# Patient Record
Sex: Female | Born: 1960 | Race: White | Hispanic: No | Marital: Married | Smoking: Current every day smoker
Health system: Southern US, Community
[De-identification: ages and names within clinical notes are randomized; demographics above are authoritative.]

## PROBLEM LIST (undated history)

## (undated) DIAGNOSIS — R030 Elevated blood-pressure reading, without diagnosis of hypertension: Secondary | ICD-10-CM

## (undated) DIAGNOSIS — Z87442 Personal history of urinary calculi: Secondary | ICD-10-CM

## (undated) DIAGNOSIS — I10 Essential (primary) hypertension: Secondary | ICD-10-CM

## (undated) DIAGNOSIS — N200 Calculus of kidney: Secondary | ICD-10-CM

## (undated) DIAGNOSIS — K5792 Diverticulitis of intestine, part unspecified, without perforation or abscess without bleeding: Secondary | ICD-10-CM

## (undated) DIAGNOSIS — K219 Gastro-esophageal reflux disease without esophagitis: Secondary | ICD-10-CM

## (undated) HISTORY — DX: Calculus of kidney: N20.0

## (undated) HISTORY — DX: Gastro-esophageal reflux disease without esophagitis: K21.9

---

## 2009-09-17 ENCOUNTER — Ambulatory Visit: Payer: Self-pay | Admitting: Family Medicine

## 2009-09-19 ENCOUNTER — Ambulatory Visit: Payer: Self-pay | Admitting: Internal Medicine

## 2016-01-28 DIAGNOSIS — I10 Essential (primary) hypertension: Secondary | ICD-10-CM | POA: Insufficient documentation

## 2016-01-28 DIAGNOSIS — Z0000 Encounter for general adult medical examination without abnormal findings: Secondary | ICD-10-CM | POA: Insufficient documentation

## 2016-01-28 DIAGNOSIS — Z720 Tobacco use: Secondary | ICD-10-CM | POA: Insufficient documentation

## 2016-01-28 LAB — HM PAP SMEAR

## 2017-07-15 ENCOUNTER — Ambulatory Visit
Admission: EM | Admit: 2017-07-15 | Discharge: 2017-07-15 | Disposition: A | Discharge status: Home / Self Care | Payer: Self-pay | Attending: Family Medicine | Admitting: Family Medicine

## 2017-07-15 ENCOUNTER — Other Ambulatory Visit: Payer: Self-pay

## 2017-07-15 ENCOUNTER — Ambulatory Visit (INDEPENDENT_AMBULATORY_CARE_PROVIDER_SITE_OTHER): Payer: Self-pay

## 2017-07-15 ENCOUNTER — Encounter: Payer: Self-pay | Admitting: Emergency Medicine

## 2017-07-15 DIAGNOSIS — M25531 Pain in right wrist: Secondary | ICD-10-CM

## 2017-07-15 DIAGNOSIS — W19XXXA Unspecified fall, initial encounter: Secondary | ICD-10-CM

## 2017-07-15 DIAGNOSIS — S52501A Unspecified fracture of the lower end of right radius, initial encounter for closed fracture: Secondary | ICD-10-CM

## 2017-07-15 MED ORDER — HYDROCODONE-ACETAMINOPHEN 5-325 MG PO TABS: ORAL_TABLET | ORAL | 0 refills | Status: DC

## 2017-07-15 MED ORDER — KETOROLAC TROMETHAMINE 60 MG/2ML IM SOLN
60.0000 mg | Freq: Once | INTRAMUSCULAR | Status: AC
  Administered 2017-07-15: 60 mg via INTRAMUSCULAR

## 2017-07-15 NOTE — ED Triage Notes (Signed)
Pt here today c/o right wrist pain. She fell in the yard last night and tried to catch herself with her wrist. It is now swollen, bruised and painful. She has decreased ROM.

## 2017-07-15 NOTE — Discharge Instructions (Signed)
Rest, elevation, over the counter ibuprofen (advil/motrin) Follow up with orthopedist this week

## 2017-07-15 NOTE — ED Provider Notes (Signed)
[facility]    CSN: [id number] Arrival date & time: 07/15/17  0802     History   Chief Complaint Chief Complaint  Patient presents with  . Wrist Pain    right    HPI ALICIANNA LITCHFORD is a 57 y.o. female.   57 yo female with a c/o right wrist pain since falling in her yard last night. States she tripped and landed on her outstretched hand with subsequent pain and swelling.   The history is provided by the patient.  Wrist Pain     History reviewed. No pertinent past medical history.  There are no active problems to display for this patient.   Past Surgical History:  Procedure Laterality Date  . CESAREAN SECTION  1985    OB History   None      Home Medications    Prior to Admission medications   Medication Sig Start Date End Date Taking? Authorizing Provider  HYDROcodone-acetaminophen (NORCO/VICODIN) 5-325 MG tablet 1-2 tabs po bid prn 07/15/17   Payton Mccallum, MD    Family History No family history on file.  Social History Social History   Tobacco Use  . Smoking status: Current Every Day Smoker    Packs/day: 0.50    Types: Cigarettes  . Smokeless tobacco: Never Used  Substance Use Topics  . Alcohol use: Never    Frequency: Never  . Drug use: Never     Allergies   Patient has no known allergies.   Review of Systems Review of Systems   Physical Exam Triage Vital Signs ED Triage Vitals  Enc Vitals Group     BP 07/15/17 0817 (!) 148/89     Pulse Rate 07/15/17 0817 76     Resp 07/15/17 0817 16     Temp 07/15/17 0817 98.9 F (37.2 C)     Temp Source 07/15/17 0817 Oral     SpO2 07/15/17 0817 100 %     Weight 07/15/17 0815 135 lb (61.2 kg)     Height 07/15/17 0815 5\' 1"  (1.549 m)     Head Circumference --      Peak Flow --      Pain Score 07/15/17 0814 3     Pain Loc --      Pain Edu? --      Excl. in GC? --    No data found.  Updated Vital Signs BP (!) 148/89 (BP Location: Left Arm)   Pulse 76   Temp 98.9 F  (37.2 C) (Oral)   Resp 16   Ht 5\' 1"  (1.549 m)   Wt 135 lb (61.2 kg)   SpO2 100%   BMI 25.51 kg/m   Visual Acuity Right Eye Distance:   Left Eye Distance:   Bilateral Distance:    Right Eye Near:   Left Eye Near:    Bilateral Near:     Physical Exam  Constitutional: She appears well-developed and well-nourished. No distress.  Musculoskeletal:       Right wrist: She exhibits decreased range of motion, tenderness, bony tenderness, swelling and deformity. She exhibits no effusion, no crepitus and no laceration.  Skin: She is not diaphoretic.  Nursing note and vitals reviewed.    UC Treatments / Results  Labs (all labs ordered are listed, but only abnormal results are displayed) Labs Reviewed - No data to display  EKG None  Radiology Dg Wrist Complete Right  Result Date: 07/15/2017 CLINICAL DATA:  Fall, landed on wrist, pain EXAM: RIGHT  WRIST - COMPLETE 3+ VIEW COMPARISON:  None. FINDINGS: Transverse distal radial fracture, without intra-articular extension. Mild soft tissue swelling. IMPRESSION: Transverse distal radial fracture, as above. Electronically Signed   By: Charline BillsSriyesh  Krishnan M.D.   On: 07/15/2017 09:00    Procedures Procedures (including critical care time)  Medications Ordered in UC Medications  ketorolac (TORADOL) injection 60 mg (60 mg Intramuscular Given 07/15/17 0836)    Initial Impression / Assessment and Plan / UC Course  I have reviewed the triage vital signs and the nursing notes.  Pertinent labs & imaging results that were available during my care of the patient were reviewed by me and considered in my medical decision making (see chart for details).     Final Clinical Impressions(s) / UC Diagnoses   Final diagnoses:  Closed fracture of distal end of right radius, unspecified fracture morphology, initial encounter     Discharge Instructions     Rest, elevation, over the counter ibuprofen (advil/motrin) Follow up with orthopedist this  week    ED Prescriptions    Medication Sig Dispense Auth. Provider   HYDROcodone-acetaminophen (NORCO/VICODIN) 5-325 MG tablet 1-2 tabs po bid prn 6 tablet Payton Mccallumonty, Taner Rzepka, MD     1. x-ray results and diagnosis reviewed with patient 2. Immobilized with sugar tong splint and sling 3. rx as per orders above; reviewed possible side effects, interactions, risks and benefits  4. Recommend supportive treatment as above   5. Follow-up with orthopedist this week  Controlled Substance Prescriptions [place] Controlled Substance Registry consulted? Not Applicable   Payton Mccallumonty, Leeta Grimme, MD 07/15/17 1143

## 2018-01-26 ENCOUNTER — Other Ambulatory Visit: Payer: Self-pay

## 2018-01-26 ENCOUNTER — Encounter: Payer: Self-pay | Admitting: Emergency Medicine

## 2018-01-26 ENCOUNTER — Ambulatory Visit
Admission: EM | Admit: 2018-01-26 | Discharge: 2018-01-26 | Disposition: A | Discharge status: Home / Self Care | Payer: Self-pay | Attending: Family Medicine | Admitting: Family Medicine

## 2018-01-26 DIAGNOSIS — R3129 Other microscopic hematuria: Secondary | ICD-10-CM | POA: Insufficient documentation

## 2018-01-26 DIAGNOSIS — M545 Low back pain, unspecified: Secondary | ICD-10-CM

## 2018-01-26 DIAGNOSIS — I10 Essential (primary) hypertension: Secondary | ICD-10-CM

## 2018-01-26 HISTORY — DX: Essential (primary) hypertension: I10

## 2018-01-26 LAB — URINALYSIS, COMPLETE (UACMP) WITH MICROSCOPIC
Bilirubin Urine: NEGATIVE
Glucose, UA: NEGATIVE mg/dL
Nitrite: NEGATIVE
Protein, ur: NEGATIVE mg/dL
Specific Gravity, Urine: 1.025 (ref 1.005–1.030)
pH: 5 (ref 5.0–8.0)

## 2018-01-26 MED ORDER — METAXALONE 800 MG PO TABS: 800.0000 mg | ORAL_TABLET | Freq: Three times a day (TID) | ORAL | 0 refills | Status: DC

## 2018-01-26 MED ORDER — TAMSULOSIN HCL 0.4 MG PO CAPS: 0.4000 mg | ORAL_CAPSULE | Freq: Every morning | ORAL | 0 refills | Status: DC

## 2018-01-26 MED ORDER — NAPROXEN 500 MG PO TABS: 500.0000 mg | ORAL_TABLET | Freq: Two times a day (BID) | ORAL | 0 refills | Status: DC

## 2018-01-26 NOTE — ED Triage Notes (Signed)
Patient c/o mid back pain that started on Sunday. She stated that she reached down in the car to grab a bag and she felt it pull when she reached down. Denies urinary symptoms.

## 2018-01-26 NOTE — Discharge Instructions (Addendum)
Plenty of fluids.  Strain all of your urine to capture stone if possible.Use  Caution while taking muscle relaxers.  Do not perform activities requiring concentration or judgment and do not drive.  If you worsen go to the emergency room.  Otherwise you need to find a primary care physician to follow and evaluate the blood in your urine

## 2018-01-26 NOTE — ED Provider Notes (Signed)
[facility]    CSN: [id number] Arrival date & time: 01/26/18  3500     History   Chief Complaint Chief Complaint  Patient presents with  . Back Pain    HPI Teresa Salazar is a 58 y.o. female.   HPI  59 year old female presents with bilateral for lumbar pain started on Sunday when she reached around her car to grab a bag of song books for church felt a pull.  Has had over time that it seem to worsen.  He said it is definitely worse at nighttime.  She is only been using ibuprofen and low dosage but does not find very much help.  Does not radiate.  No previous history of any stones.  She is had no urinary symptoms.  Knows the pain on both sides of the spine.  Denies any incontinence.  Has symptoms of a head cold.      Past Medical History:  Diagnosis Date  . Hypertension     There are no active problems to display for this patient.   Past Surgical History:  Procedure Laterality Date  . CESAREAN SECTION  1985    OB History   No obstetric history on file.      Home Medications    Prior to Admission medications   Medication Sig Start Date End Date Taking? Authorizing Provider  lisinopril (PRINIVIL,ZESTRIL) 10 MG tablet Take by mouth. 01/29/16  Yes [provider]  metaxalone (SKELAXIN) 800 MG tablet Take 1 tablet (800 mg total) by mouth 3 (three) times daily. 01/26/18   Lutricia Feil, PA-C  naproxen (NAPROSYN) 500 MG tablet Take 1 tablet (500 mg total) by mouth 2 (two) times daily with a meal. 01/26/18   Lutricia Feil, PA-C  tamsulosin (FLOMAX) 0.4 MG CAPS capsule Take 1 capsule (0.4 mg total) by mouth every morning. 01/26/18   Lutricia Feil, PA-C    Family History History reviewed. No pertinent family history.  Social History Social History   Tobacco Use  . Smoking status: Current Every Day Smoker    Packs/day: 0.50    Types: Cigarettes  . Smokeless tobacco: Never Used  Substance Use Topics  . Alcohol use: Never   Frequency: Never  . Drug use: Never     Allergies   Patient has no known allergies.   Review of Systems Review of Systems  Constitutional: Positive for activity change. Negative for appetite change, chills, fatigue and fever.  HENT: Positive for congestion, postnasal drip and rhinorrhea.   Musculoskeletal: Positive for back pain and myalgias.  All other systems reviewed and are negative.    Physical Exam Triage Vital Signs ED Triage Vitals  Enc Vitals Group     BP 01/26/18 0831 (!) 151/100     Pulse Rate 01/26/18 0831 98     Resp 01/26/18 0831 18     Temp 01/26/18 0831 98 F (36.7 C)     Temp Source 01/26/18 0831 Oral     SpO2 01/26/18 0831 98 %     Weight 01/26/18 0832 140 lb (63.5 kg)     Height 01/26/18 0832 5\' 2"  (1.575 m)     Head Circumference --      Peak Flow --      Pain Score 01/26/18 0831 7     Pain Loc --      Pain Edu? --      Excl. in GC? --    No data found.  Updated Vital Signs BP Marland Kitchen)  151/100 (BP Location: Left Arm)   Pulse 98   Temp 98 F (36.7 C) (Oral)   Resp 18   Ht 5\' 2"  (1.575 m)   Wt 140 lb (63.5 kg)   SpO2 98%   BMI 25.61 kg/m   Visual Acuity Right Eye Distance:   Left Eye Distance:   Bilateral Distance:    Right Eye Near:   Left Eye Near:    Bilateral Near:     Physical Exam Vitals signs and nursing note reviewed. Exam conducted with a chaperone present.  Constitutional:      General: She is not in acute distress.    Appearance: Normal appearance. She is normal weight. She is not ill-appearing, toxic-appearing or diaphoretic.  HENT:     Head: Normocephalic.     Nose: Rhinorrhea present. No congestion.     Mouth/Throat:     Mouth: Mucous membranes are moist.     Pharynx: No oropharyngeal exudate or posterior oropharyngeal erythema.  Eyes:     General:        Right eye: No discharge.        Left eye: No discharge.     Conjunctiva/sclera: Conjunctivae normal.  Pulmonary:     Effort: Pulmonary effort is normal.      Breath sounds: Normal breath sounds.  Musculoskeletal:        General: Tenderness present.     Comments: Has a slightly high pelvis on the left in stance.  Lection shows a rightward takeoff she is able to forward flex with her hands to the level of her mid thigh.  Turning to upright posture is difficult.  Bilateral flexion is also limited due to pain.  That is worse to the right than the left.  Thoracic rotation also causes her to have discomfort.  Maximum tenderness is in the paraspinous muscles at the L3-4 level.  Will toe heel and heel walk normally.  DTRs are 2+/4 bilaterally symmetrical.  Skin:    General: Skin is warm and dry.  Neurological:     General: No focal deficit present.     Mental Status: She is alert and oriented to person, place, and time.  Psychiatric:        Mood and Affect: Mood normal.        Behavior: Behavior normal.        Thought Content: Thought content normal.        Judgment: Judgment normal.      UC Treatments / Results  Labs (all labs ordered are listed, but only abnormal results are displayed) Labs Reviewed  URINALYSIS, COMPLETE (UACMP) WITH MICROSCOPIC - Abnormal; Notable for the following components:      Result Value   APPearance HAZY (*)    Hgb urine dipstick MODERATE (*)    Ketones, ur TRACE (*)    Leukocytes, UA TRACE (*)    Bacteria, UA RARE (*)    All other components within normal limits  URINE CULTURE    EKG None  Radiology No results found.  Procedures Procedures (including critical care time)  Medications Ordered in UC Medications - No data to display  Initial Impression / Assessment and Plan / UC Course  I have reviewed the triage vital signs and the nursing notes.  Pertinent labs & imaging results that were available during my care of the patient were reviewed by me and considered in my medical decision making (see chart for details).   I have a discussion with the patient regarding her  laboratory and our physical  findings as well as her presentation.  Has had kidney stones in the past and this may be another kidney stone due to the hematuria that she exhibits.  States that the kidney stones were years ago and she also has not had a period as well.  She denies any vaginal discharge.  With her previous history of kidney stones it is likely that her back pain in part may be from kidney stones but also is for the pain that she is having after lifting the bag which seem to precipitate her symptoms.  She has no insurance and would prefer not to undergo CAT scan today.  Is reasonable to have her screen all of her urine and to treat her for a low back strain.  She will go to the emergency room if it worsens.  Otherwise I have recommended highly that she have a primary care physician who will follow her and further evaluate her hematuria.   Final Clinical Impressions(s) / UC Diagnoses   Final diagnoses:  Acute bilateral low back pain without sciatica  Other microscopic hematuria     Discharge Instructions     Plenty of fluids.  Strain all of your urine to capture stone if possible.Use  Caution while taking muscle relaxers.  Do not perform activities requiring concentration or judgment and do not drive.  If you worsen go to the emergency room.  Otherwise you need to find a primary care physician to follow and evaluate the blood in your urine    ED Prescriptions    Medication Sig Dispense Auth. Provider   tamsulosin (FLOMAX) 0.4 MG CAPS capsule Take 1 capsule (0.4 mg total) by mouth every morning. 30 capsule Ovid Curd P, PA-C   naproxen (NAPROSYN) 500 MG tablet Take 1 tablet (500 mg total) by mouth 2 (two) times daily with a meal. 60 tablet Lutricia Feil, PA-C   metaxalone (SKELAXIN) 800 MG tablet Take 1 tablet (800 mg total) by mouth 3 (three) times daily. 21 tablet Lutricia Feil, PA-C     Controlled Substance Prescriptions [place] Controlled Substance Registry consulted? No   Lutricia Feil,  PA-C 01/26/18 1007

## 2018-01-27 LAB — URINE CULTURE: Culture: NO GROWTH

## 2020-01-05 DIAGNOSIS — N321 Vesicointestinal fistula: Secondary | ICD-10-CM

## 2020-01-05 HISTORY — DX: Vesicointestinal fistula: N32.1

## 2020-04-10 ENCOUNTER — Encounter: Payer: Self-pay | Admitting: Emergency Medicine

## 2020-04-10 ENCOUNTER — Other Ambulatory Visit: Payer: Self-pay

## 2020-04-10 ENCOUNTER — Ambulatory Visit
Admission: EM | Admit: 2020-04-10 | Discharge: 2020-04-10 | Disposition: A | Discharge status: Home / Self Care | Payer: Self-pay | Attending: Sports Medicine | Admitting: Sports Medicine

## 2020-04-10 DIAGNOSIS — K2100 Gastro-esophageal reflux disease with esophagitis, without bleeding: Secondary | ICD-10-CM

## 2020-04-10 DIAGNOSIS — R300 Dysuria: Secondary | ICD-10-CM | POA: Insufficient documentation

## 2020-04-10 DIAGNOSIS — R310 Gross hematuria: Secondary | ICD-10-CM

## 2020-04-10 LAB — URINALYSIS, COMPLETE (UACMP) WITH MICROSCOPIC
Bacteria, UA: NONE SEEN
Bilirubin Urine: NEGATIVE
Glucose, UA: NEGATIVE mg/dL
Ketones, ur: 15 mg/dL — AB
Leukocytes,Ua: NEGATIVE
Nitrite: NEGATIVE
Protein, ur: 100 mg/dL — AB
RBC / HPF: >50 RBC/hpf (ref 0–5)
Specific Gravity, Urine: >1.03 — ABNORMAL HIGH (ref 1.005–1.030)
WBC, UA: NONE SEEN WBC/hpf (ref 0–5)
pH: 5.5 (ref 5.0–8.0)

## 2020-04-10 MED ORDER — OMEPRAZOLE 20 MG PO CPDR: 20.0000 mg | DELAYED_RELEASE_CAPSULE | Freq: Every day | ORAL | 0 refills | Status: DC

## 2020-04-10 MED ORDER — FAMOTIDINE 20 MG PO TABS: 20.0000 mg | ORAL_TABLET | Freq: Two times a day (BID) | ORAL | 0 refills | Status: DC

## 2020-04-10 NOTE — ED Provider Notes (Signed)
[facility]    CSN: [id number] Arrival date & time: 04/10/20  1419      History   Chief Complaint Chief Complaint  Patient presents with  . Hematuria  . Dysuria       . Abdominal Pain  . Rash    HPI Teresa Salazar is a 60 y.o. female.   Patient is a pleasant 60 year old female who presents for evaluation of several issues.  The first is blood in her urine with dysuria.  Its been present now for about 2 months.  She is a smoker.  She reports no increased frequency or urgency.  She also reports no vaginal discharge or vaginal bleeding.  No vaginal pain either.  No flank pain.  She reports some mild suprapubic discomfort as well.  She has been trying to establish care with a PCP.  She reports that she has an appointment in [place] on April 17, 2020.  Her second issue is suprapubic abdominal pain.  She points to the epigastric area.  She says is worse if she drinks a lot of soda.  She did vomit today.  Is also worse if she eats spicy foods.  She denies any significant chest pain shortness of breath.  There is no jaw pain or diaphoresis.  No red flag signs or symptoms were elicited on history.     Past Medical History:  Diagnosis Date  . Hypertension     There are no problems to display for this patient.   Past Surgical History:  Procedure Laterality Date  . CESAREAN SECTION  1985    OB History   No obstetric history on file.      Home Medications    Prior to Admission medications   Medication Sig Start Date End Date Taking? Authorizing Provider  famotidine (PEPCID) 20 MG tablet Take 1 tablet (20 mg total) by mouth 2 (two) times daily. 04/10/20  Yes Delton See, MD  omeprazole (PRILOSEC) 20 MG capsule Take 1 capsule (20 mg total) by mouth daily. 04/10/20  Yes Delton See, MD  lisinopril (PRINIVIL,ZESTRIL) 10 MG tablet Take by mouth. 01/29/16   [provider]  metaxalone (SKELAXIN) 800 MG tablet Take 1 tablet (800 mg total) by  mouth 3 (three) times daily. 01/26/18   Lutricia Feil, PA-C  naproxen (NAPROSYN) 500 MG tablet Take 1 tablet (500 mg total) by mouth 2 (two) times daily with a meal. 01/26/18   Lutricia Feil, PA-C  tamsulosin (FLOMAX) 0.4 MG CAPS capsule Take 1 capsule (0.4 mg total) by mouth every morning. 01/26/18   Lutricia Feil, PA-C    Family History Family History  Problem Relation Age of Onset  . Diabetes Mother   . Diabetes Father     Social History Social History   Tobacco Use  . Smoking status: Current Every Day Smoker    Packs/day: 0.50    Types: Cigarettes  . Smokeless tobacco: Never Used  Vaping Use  . Vaping Use: Never used  Substance Use Topics  . Alcohol use: Never  . Drug use: Never     Allergies   Patient has no known allergies.   Review of Systems Review of Systems  Constitutional: Positive for appetite change. Negative for activity change, chills, diaphoresis, fatigue and fever.  HENT: Negative.  Negative for congestion, ear pain, postnasal drip, rhinorrhea, sinus pressure and sinus pain.   Eyes: Negative.  Negative for pain.  Respiratory: Negative for cough, chest tightness, shortness of breath, wheezing  and stridor.   Cardiovascular: Negative.  Negative for chest pain and palpitations.  Gastrointestinal: Positive for abdominal pain, nausea and vomiting. Negative for abdominal distention, blood in stool, constipation and diarrhea.  Genitourinary: Positive for dysuria and hematuria. Negative for flank pain, urgency, vaginal bleeding, vaginal discharge and vaginal pain.  Musculoskeletal: Negative.  Negative for arthralgias, back pain, myalgias and neck pain.  Skin: Positive for rash. Negative for color change, pallor and wound.  Neurological: Negative.  Negative for dizziness, syncope, light-headedness, numbness and headaches.  Hematological: Negative.  Negative for adenopathy. Does not bruise/bleed easily.  All other systems reviewed and are  negative.    Physical Exam Triage Vital Signs ED Triage Vitals  Enc Vitals Group     BP 04/10/20 1434 (!) 168/94     Pulse Rate 04/10/20 1434 78     Resp 04/10/20 1434 18     Temp 04/10/20 1434 98.6 F (37 C)     Temp Source 04/10/20 1434 Oral     SpO2 04/10/20 1434 99 %     Weight 04/10/20 1432 139 lb 15.9 oz (63.5 kg)     Height 04/10/20 1432 5\' 2"  (1.575 m)     Head Circumference --      Peak Flow --      Pain Score 04/10/20 1432 6     Pain Loc --      Pain Edu? --      Excl. in GC? --    No data found.  Updated Vital Signs BP (!) 168/94 (BP Location: Left Arm)   Pulse 78   Temp 98.6 F (37 C) (Oral)   Resp 18   Ht 5\' 2"  (1.575 m)   Wt 63.5 kg   SpO2 99%   BMI 25.60 kg/m   Visual Acuity Right Eye Distance:   Left Eye Distance:   Bilateral Distance:    Right Eye Near:   Left Eye Near:    Bilateral Near:     Physical Exam Vitals and nursing note reviewed.  Constitutional:      General: She is not in acute distress.    Appearance: She is well-developed. She is not ill-appearing, toxic-appearing or diaphoretic.  HENT:     Head: Normocephalic and atraumatic.     Mouth/Throat:     Mouth: Mucous membranes are moist.     Pharynx: Oropharynx is clear. No pharyngeal swelling or oropharyngeal exudate.  Eyes:     General: No scleral icterus.    Extraocular Movements: Extraocular movements intact.     Pupils: Pupils are equal, round, and reactive to light.  Cardiovascular:     Rate and Rhythm: Normal rate and regular rhythm.     Heart sounds: Normal heart sounds. No murmur heard. No friction rub. No gallop.   Pulmonary:     Effort: Pulmonary effort is normal. No respiratory distress.     Breath sounds: Normal breath sounds. No stridor. No wheezing, rhonchi or rales.  Abdominal:     General: Abdomen is flat. Bowel sounds are normal. There is no distension. There are no signs of injury.     Palpations: Abdomen is soft. There is no shifting dullness, fluid  wave, hepatomegaly or splenomegaly.     Tenderness: There is abdominal tenderness in the suprapubic area. There is no right CVA tenderness, left CVA tenderness, guarding or rebound.  Skin:    General: Skin is warm.     Capillary Refill: Capillary refill takes less than 2 seconds.  Coloration: Skin is not cyanotic or jaundiced.     Findings: No erythema or rash.     Comments: No rash appreciated  Neurological:     General: No focal deficit present.     Mental Status: She is alert and oriented to person, place, and time.  Psychiatric:        Mood and Affect: Mood normal.        Behavior: Behavior normal.      UC Treatments / Results  Labs (all labs ordered are listed, but only abnormal results are displayed) Labs Reviewed  URINALYSIS, COMPLETE (UACMP) WITH MICROSCOPIC - Abnormal; Notable for the following components:      Result Value   Color, Urine BROWN (*)    Specific Gravity, Urine >1.030 (*)    Hgb urine dipstick LARGE (*)    Ketones, ur 15 (*)    Protein, ur 100 (*)    All other components within normal limits    EKG   Radiology No results found.  Procedures Procedures (including critical care time)  Medications Ordered in UC Medications - No data to display  Initial Impression / Assessment and Plan / UC Course  I have reviewed the triage vital signs and the nursing notes.  Pertinent labs & imaging results that were available during my care of the patient were reviewed by me and considered in my medical decision making (see chart for details).  Clinical impression: 1.  Hematuria with dysuria and suprapubic pain for 2 months. 2.  Epigastric discomfort worse if drinks soda or eats spicy foods.  Treatment plan: 1.  The findings and treatment plan were discussed in detail with the patient.  Patient was in agreement. 2.  Obtain a UA the results are above.  Urine was brown in color with elevated specific gravity of greater than 1.030.  There was a large amount  of hemoglobin small amount of ketones and 100 protein.  Greater than 50 RBCs noted.  No evidence of a urinary tract infection. 3.  Going to refer to urology for gross hematuria.  She is not giving me any indication she has kidney stones. 4.  Follow-up with her primary care physician next week. 5.  I prescribed Pepcid and Prilosec for her acid reflux symptoms.  6 educational handouts provided. 7.  Follow-up here as needed.    Final Clinical Impressions(s) / UC Diagnoses   Final diagnoses:  Gross hematuria  Gastroesophageal reflux disease with esophagitis without hemorrhage     Discharge Instructions     Your urine shows a large amount of blood.  I have made a referral to urology.  I have also given you the name and number of the urology clinic that you should call and say that you are seen in urgent care and they recommended you be seen as soon as possible. I did give you some educational handouts. Please keep your appointment with your primary care in [place] next week to establish care.  Regarding her epigastric pain I have prescribed a medication to assist with that. If you develop worsening bleeding, or you develop any significant chest pain or shortness of breath please call 911 and go to the ER.    ED Prescriptions    Medication Sig Dispense Auth. Provider   omeprazole (PRILOSEC) 20 MG capsule Take 1 capsule (20 mg total) by mouth daily. 30 capsule Delton See, MD   famotidine (PEPCID) 20 MG tablet Take 1 tablet (20 mg total) by mouth 2 (two) times  daily. 30 tablet Delton See, MD     PDMP not reviewed this encounter.   Delton See, MD 04/13/20 [phone]

## 2020-04-10 NOTE — ED Triage Notes (Signed)
Patient c/o hematuria, dysuria and abdominal pain that started 2 months ago. She also reports a rash on her back that also started 2 months ago.

## 2020-04-10 NOTE — Discharge Instructions (Addendum)
Your urine shows a large amount of blood.  I have made a referral to urology.  I have also given you the name and number of the urology clinic that you should call and say that you are seen in urgent care and they recommended you be seen as soon as possible. I did give you some educational handouts. Please keep your appointment with your primary care in [place] next week to establish care.  Regarding her epigastric pain I have prescribed a medication to assist with that. If you develop worsening bleeding, or you develop any significant chest pain or shortness of breath please call 911 and go to the ER.

## 2020-04-14 ENCOUNTER — Other Ambulatory Visit: Payer: Self-pay

## 2020-04-14 DIAGNOSIS — R310 Gross hematuria: Secondary | ICD-10-CM

## 2020-04-15 ENCOUNTER — Ambulatory Visit: Payer: Self-pay | Admitting: Urology

## 2020-04-15 ENCOUNTER — Other Ambulatory Visit: Payer: Self-pay

## 2020-04-15 ENCOUNTER — Ambulatory Visit
Admission: RE | Admit: 2020-04-15 | Discharge: 2020-04-15 | Disposition: A | Discharge status: Home / Self Care | Payer: Self-pay | Source: Ambulatory Visit | Attending: Urology | Admitting: Urology

## 2020-04-15 ENCOUNTER — Ambulatory Visit (INDEPENDENT_AMBULATORY_CARE_PROVIDER_SITE_OTHER): Payer: Self-pay | Admitting: Urology

## 2020-04-15 ENCOUNTER — Other Ambulatory Visit
Admission: RE | Admit: 2020-04-15 | Discharge: 2020-04-15 | Disposition: A | Discharge status: Home / Self Care | Payer: Self-pay | Attending: Urology | Admitting: Urology

## 2020-04-15 ENCOUNTER — Encounter: Payer: Self-pay | Admitting: Urology

## 2020-04-15 VITALS — BP 155/88 | HR 80 | Ht 62.0 in | Wt 115.0 lb

## 2020-04-15 DIAGNOSIS — R310 Gross hematuria: Secondary | ICD-10-CM | POA: Insufficient documentation

## 2020-04-15 LAB — COMPREHENSIVE METABOLIC PANEL
ALT: 14 U/L (ref 0–44)
AST: 17 U/L (ref 15–41)
Albumin: 3.6 g/dL (ref 3.5–5.0)
Alkaline Phosphatase: 81 U/L (ref 38–126)
Anion gap: 10 (ref 5–15)
BUN: 22 mg/dL — ABNORMAL HIGH (ref 6–20)
CO2: 23 mmol/L (ref 22–32)
Calcium: 9.1 mg/dL (ref 8.9–10.3)
Chloride: 106 mmol/L (ref 98–111)
Creatinine, Ser: 0.74 mg/dL (ref 0.44–1.00)
GFR, Estimated: >60 mL/min (ref >60)
Glucose, Bld: 96 mg/dL (ref 70–99)
Potassium: 3.7 mmol/L (ref 3.5–5.1)
Sodium: 139 mmol/L (ref 135–145)
Total Bilirubin: 0.3 mg/dL (ref 0.3–1.2)
Total Protein: 7.9 g/dL (ref 6.5–8.1)

## 2020-04-15 LAB — CBC WITH DIFFERENTIAL/PLATELET
Abs Immature Granulocytes: 0.05 10*3/uL (ref 0.00–0.07)
Basophils Absolute: 0.1 10*3/uL (ref 0.0–0.1)
Basophils Relative: 1 %
Eosinophils Absolute: 0.1 10*3/uL (ref 0.0–0.5)
Eosinophils Relative: 1 %
HCT: 36.2 % (ref 36.0–46.0)
Hemoglobin: 11.6 g/dL — ABNORMAL LOW (ref 12.0–15.0)
Immature Granulocytes: 1 %
Lymphocytes Relative: 20 %
Lymphs Abs: 1.9 10*3/uL (ref 0.7–4.0)
MCH: 27.6 pg (ref 26.0–34.0)
MCHC: 32 g/dL (ref 30.0–36.0)
MCV: 86 fL (ref 80.0–100.0)
Monocytes Absolute: 0.4 10*3/uL (ref 0.1–1.0)
Monocytes Relative: 4 %
Neutro Abs: 6.7 10*3/uL (ref 1.7–7.7)
Neutrophils Relative %: 73 %
Platelets: 408 10*3/uL — ABNORMAL HIGH (ref 150–400)
RBC: 4.21 MIL/uL (ref 3.87–5.11)
RDW: 15.9 % — ABNORMAL HIGH (ref 11.5–15.5)
WBC: 9.1 10*3/uL (ref 4.0–10.5)
nRBC: 0 % (ref 0.0–0.2)

## 2020-04-15 LAB — URINALYSIS, COMPLETE (UACMP) WITH MICROSCOPIC
Bacteria, UA: NONE SEEN
Bilirubin Urine: NEGATIVE
Glucose, UA: NEGATIVE mg/dL
Ketones, ur: 15 mg/dL — AB
Nitrite: NEGATIVE
Protein, ur: NEGATIVE mg/dL
RBC / HPF: >50 RBC/hpf (ref 0–5)
Specific Gravity, Urine: 1.025 (ref 1.005–1.030)
pH: 5.5 (ref 5.0–8.0)

## 2020-04-15 MED ORDER — IOHEXOL 300 MG/ML  SOLN
150.0000 mL | Freq: Once | INTRAMUSCULAR | Status: AC | PRN
  Administered 2020-04-15: 125 mL via INTRAVENOUS

## 2020-04-15 NOTE — H&P (View-Only) (Signed)
   04/15/20 2:28 PM    Sink 04-09-1960 [id number]  CC: Gross hematuria, weight loss  HPI: I saw Teresa Salazar today in clinic for evaluation of gross hematuria.  She is a 60 year old female with extensive smoking history(75 pack years) who reports 2 to 3 months of gross hematuria.  She also reports approximately 30 pounds of weight loss during that time.  She does not regularly get medical care.  She is also having some lower abdominal pain that is nonspecific.  She denies any renal colic or flank pain.  Urinalysis at urgent care notable for greater than 50 RBCs, but otherwise benign.  No imaging has been performed.   PMH: Past Medical History:  Diagnosis Date  . Hypertension     Surgical History: Past Surgical History:  Procedure Laterality Date  . CESAREAN SECTION  1985     Family History: Family History  Problem Relation Age of Onset  . Diabetes Mother   . Diabetes Father     Social History:  reports that she has been smoking cigarettes. She has been smoking about 0.50 packs per day. She has never used smokeless tobacco. She reports that she does not drink alcohol and does not use drugs.  Physical Exam: BP (!) 155/88   Pulse 80   Ht 5\' 2"  (1.575 m)   Wt 115 lb (52.2 kg)   BMI 21.03 kg/m    Constitutional:  Alert and oriented, No acute distress. Cardiovascular: No clubbing, cyanosis, or edema. Respiratory: Normal respiratory effort, no increased work of breathing. GI: Abdomen is soft, mildly tender in left lower quadrant, no peritonitis, nondistended  Laboratory Data: Reviewed, see HPI  Pertinent Imaging: None to review  Assessment & Plan:   60 year old female with 2 to 3 months of gross hematuria as well as 30 pounds of weight loss and vague lower abdominal pain of unclear etiology.  She has extensive smoking history concerning for malignancy. We discussed common possible etiologies of hematuria including BPH, malignancy, urolithiasis, medical  renal disease, and idiopathic. Standard workup recommended by the [facility] includes imaging with CT urogram to assess the upper tracts, and cystoscopy. Cytology is performed on patient's with gross hematuria to look for malignant cells in the urine.  CMP and CBC today Stat CT urogram, will call with results Schedule cystoscopy to complete hematuria work-up   46, MD 04/15/2020  [facility] [address], [address] [place], [place] [phone]

## 2020-04-15 NOTE — Progress Notes (Signed)
   04/15/20 2:28 PM   Teresa Salazar 04/21/1960 [id number]  CC: Gross hematuria, weight loss  HPI: I saw Teresa Salazar today in clinic for evaluation of gross hematuria.  She is a 59-year-old female with extensive smoking history(75 pack years) who reports 2 to 3 months of gross hematuria.  She also reports approximately 30 pounds of weight loss during that time.  She does not regularly get medical care.  She is also having some lower abdominal pain that is nonspecific.  She denies any renal colic or flank pain.  Urinalysis at urgent care notable for greater than 50 RBCs, but otherwise benign.  No imaging has been performed.   PMH: Past Medical History:  Diagnosis Date  . Hypertension     Surgical History: Past Surgical History:  Procedure Laterality Date  . CESAREAN SECTION  1985     Family History: Family History  Problem Relation Age of Onset  . Diabetes Mother   . Diabetes Father     Social History:  reports that she has been smoking cigarettes. She has been smoking about 0.50 packs per day. She has never used smokeless tobacco. She reports that she does not drink alcohol and does not use drugs.  Physical Exam: BP (!) 155/88   Pulse 80   Ht 5' 2" (1.575 m)   Wt 115 lb (52.2 kg)   BMI 21.03 kg/m    Constitutional:  Alert and oriented, No acute distress. Cardiovascular: No clubbing, cyanosis, or edema. Respiratory: Normal respiratory effort, no increased work of breathing. GI: Abdomen is soft, mildly tender in left lower quadrant, no peritonitis, nondistended  Laboratory Data: Reviewed, see HPI  Pertinent Imaging: None to review  Assessment & Plan:   59-year-old female with 2 to 3 months of gross hematuria as well as 30 pounds of weight loss and vague lower abdominal pain of unclear etiology.  She has extensive smoking history concerning for malignancy. We discussed common possible etiologies of hematuria including BPH, malignancy, urolithiasis, medical  renal disease, and idiopathic. Standard workup recommended by the [facility]UA includes imaging with CT urogram to assess the upper tracts, and cystoscopy. Cytology is performed on patient's with gross hematuria to look for malignant cells in the urine.  CMP and CBC today Stat CT urogram, will call with results Schedule cystoscopy to complete hematuria work-up   Laycie Schriner, MD 04/15/2020  [facility] [address], [address] [place], [place] [address] [phone]   

## 2020-04-16 ENCOUNTER — Other Ambulatory Visit: Payer: Self-pay

## 2020-04-16 ENCOUNTER — Encounter: Payer: Self-pay | Admitting: Surgery

## 2020-04-16 ENCOUNTER — Ambulatory Visit: Payer: Self-pay | Admitting: Surgery

## 2020-04-16 ENCOUNTER — Telehealth: Payer: Self-pay

## 2020-04-16 VITALS — BP 156/88 | HR 87 | Temp 98.6°F | Ht 61.0 in | Wt 115.4 lb

## 2020-04-16 DIAGNOSIS — Z1239 Encounter for other screening for malignant neoplasm of breast: Secondary | ICD-10-CM

## 2020-04-16 DIAGNOSIS — L988 Other specified disorders of the skin and subcutaneous tissue: Secondary | ICD-10-CM

## 2020-04-16 DIAGNOSIS — J438 Other emphysema: Secondary | ICD-10-CM

## 2020-04-16 LAB — URINE CULTURE: Culture: NO GROWTH

## 2020-04-16 MED ORDER — METRONIDAZOLE 500 MG PO TABS: 500.0000 mg | ORAL_TABLET | Freq: Three times a day (TID) | ORAL | 0 refills | Status: AC

## 2020-04-16 MED ORDER — CIPROFLOXACIN HCL 500 MG PO TABS: 500.0000 mg | ORAL_TABLET | Freq: Two times a day (BID) | ORAL | 0 refills | Status: AC

## 2020-04-16 NOTE — Patient Instructions (Addendum)
Please pick up prescriptions from your pharmacy. Your CT is scheduled for 05/01/20 @ 1 pm (arrive 15 minutes early) at [facility]. Nothing to eat or drink 4 hours prior to scan. Please pick up contrast at any [facility] Department between now and the day before CT. If you have any concerns or questions, please feel free to call our office.

## 2020-04-16 NOTE — Telephone Encounter (Signed)
Patient added to Dr.Pabon's schedule today-left message for patient to call office.

## 2020-04-16 NOTE — Progress Notes (Signed)
Patient ID: Teresa Salazar, female   DOB: 01-08-1960, 60 y.o.   MRN: [id number]  HPI Teresa Salazar is a 60 y.o. female seen in consultation at the request of Dr. Richardo Hanks.  She recently saw Dr. Renaldo Harrison due to lower abdominal pain and recurrent urinary tract infections.  She reports that she has been having the lower abdominal pain for quite a while and is close to 4 to 8 weeks.  Pain is intermittent dull and moderate in nature.  No specific alleviating or aggravating factors.  She does report burning when passing urine and also pneumaturia.  Also reports about 20 to 30 pound weight loss over the last 3 months. She is saw urology yesterday and a CT scan was ordered.  I have personally reviewed the CT scan showing evidence of diverticulitis with colovesicle fistula and a small abscess within the mesentery of the sigmoid colon.  The small collection unlikely to be amenable for percutaneous drainage due to multiple loops of bowel being in between the abscess and the abdominal wall.  He denies any fevers any chills.  She is has significant debility and fatigue  HPI  Past Medical History:  Diagnosis Date  . Hypertension     Past Surgical History:  Procedure Laterality Date  . CESAREAN SECTION  1985    Family History  Problem Relation Age of Onset  . Diabetes Mother   . Diabetes Father     Social History Social History   Tobacco Use  . Smoking status: Current Every Day Smoker    Packs/day: 0.50    Types: Cigarettes  . Smokeless tobacco: Never Used  Vaping Use  . Vaping Use: Never used  Substance Use Topics  . Alcohol use: Never  . Drug use: Never    No Known Allergies  Current Outpatient Medications  Medication Sig Dispense Refill  . ciprofloxacin (CIPRO) 500 MG tablet Take 1 tablet (500 mg total) by mouth 2 (two) times daily for 14 days. 28 tablet 0  . famotidine (PEPCID) 20 MG tablet Take 1 tablet (20 mg total) by mouth 2 (two) times daily. 30 tablet 0  . metroNIDAZOLE  (FLAGYL) 500 MG tablet Take 1 tablet (500 mg total) by mouth 3 (three) times daily for 14 days. 42 tablet 0  . lisinopril (PRINIVIL,ZESTRIL) 10 MG tablet Take by mouth. (Patient not taking: Reported on 04/16/2020)    . metaxalone (SKELAXIN) 800 MG tablet Take 1 tablet (800 mg total) by mouth 3 (three) times daily. (Patient not taking: Reported on 04/16/2020) 21 tablet 0  . naproxen (NAPROSYN) 500 MG tablet Take 1 tablet (500 mg total) by mouth 2 (two) times daily with a meal. (Patient not taking: Reported on 04/16/2020) 60 tablet 0  . omeprazole (PRILOSEC) 20 MG capsule Take 1 capsule (20 mg total) by mouth daily. (Patient not taking: Reported on 04/16/2020) 30 capsule 0  . tamsulosin (FLOMAX) 0.4 MG CAPS capsule Take 1 capsule (0.4 mg total) by mouth every morning. (Patient not taking: Reported on 04/16/2020) 30 capsule 0   No current facility-administered medications for this visit.     Review of Systems Full ROS  was asked and was negative except for the information on the HPI  Physical Exam Blood pressure (!) 156/88, pulse 87, temperature 98.6 F (37 C), temperature source Oral, height 5\' 1"  (1.549 m), weight 115 lb 6.4 oz (52.3 kg), SpO2 97 %. CONSTITUTIONAL: debilitated and malnourished  EYES: Pupils are equal, round, , Sclera are non-icteric. EARS, NOSE, MOUTH  AND THROAT: T she is wearing a mask , hearing is intact to voice. LYMPH NODES:  Lymph nodes in the neck are normal. RESPIRATORY:  Lungs are clear. There is normal respiratory effort, with equal breath sounds bilaterally, and without pathologic use of accessory muscles. CARDIOVASCULAR: Heart is regular without murmurs, gallops, or rubs. GI: The abdomen is  soft, mild tenderness palpation left lower quadrant without peritonitis and nondistended. There are no palpable masses. There is no hepatosplenomegaly. There are normal bowel sounds GU: Rectal deferred.   MUSCULOSKELETAL: Normal muscle strength and tone. No cyanosis or edema.    SKIN: Turgor is good and there are no pathologic skin lesions or ulcers. NEUROLOGIC: Motor and sensation is grossly normal. Cranial nerves are grossly intact. PSYCH:  Oriented to person, place and time. Affect is normal.  Data Reviewed  I have personally reviewed the patient's imaging, laboratory findings and medical records.    Assessment/Plan 60 year old female with diverticulitis and a small abscess as well as a colovesical fistula.  We will start her on broad-spectrum antibiotics ciprofloxacin and Flagyl for at least 2 weeks.  She is not toxic at does not require admission or surgical intervention.  She does have the abscess but is deep within the mesentery and it is small and should respond to antibiotic therapy.  I will have her come back in 2 to 3 weeks with a repeat CT to assess for active diverticulitis and abscess.  Discussed with the patient detail about the further need for sigmoidoscopy at some point in time and continue urological work-up.  Given that she has complicated diverticulitis and ongoing fistula she will require sigmoid colectomy at some point in time.  I had an extensive discussion with the patient regarding this.  She does not have any medical care and given all the risk factors I do think is prudent to go ahead and obtain a chest x-ray, mammography .  We will see her back in 2 to 3 weeks.  A copy of this report was sent to the referring provider   Time spent with the patient was 65 minutes, with more than 50% of the time spent in face-to-face education, counseling and care coordination.     Sterling Big, MD FACS General Surgeon 04/16/2020, 3:12 PM

## 2020-04-17 ENCOUNTER — Ambulatory Visit: Payer: Self-pay | Admitting: Family Medicine

## 2020-04-17 ENCOUNTER — Other Ambulatory Visit: Payer: Self-pay

## 2020-04-17 ENCOUNTER — Encounter: Payer: Self-pay | Admitting: Family Medicine

## 2020-04-17 VITALS — BP 132/68 | HR 77 | Temp 97.7°F | Ht 61.0 in | Wt 115.2 lb

## 2020-04-17 DIAGNOSIS — N200 Calculus of kidney: Secondary | ICD-10-CM

## 2020-04-17 DIAGNOSIS — R310 Gross hematuria: Secondary | ICD-10-CM

## 2020-04-17 DIAGNOSIS — N321 Vesicointestinal fistula: Secondary | ICD-10-CM

## 2020-04-17 DIAGNOSIS — K5792 Diverticulitis of intestine, part unspecified, without perforation or abscess without bleeding: Secondary | ICD-10-CM

## 2020-04-17 DIAGNOSIS — K219 Gastro-esophageal reflux disease without esophagitis: Secondary | ICD-10-CM

## 2020-04-17 NOTE — Progress Notes (Signed)
Subjective:    Patient ID: Teresa Salazar, female    DOB: October 17, 1960, 60 y.o.   MRN: [id number]  Teresa Salazar is a 60 y.o. female presenting on 04/17/2020 for Establish Care and Hematuria  Previous PCP in [facility].  HPI   Gross Hematuria / History of Kidney Stones Dysuria Urgent Care on 04/10/20 - [place] 2-3 months of hematuria reported. She is a smoker. She had UA done at Urgent Care, they referred her to [facility]. She had CT imaging done on 4/12  Showed nephrolithiasis, and kidney cyst also rectosigmoid fistula. Upcoming cystoscopy on 4/21 with Urology. Future repeat CT imaging.  Colo-vesicular Fistula Diverticulitis Followed by Dr Everlene Farrier Recently seen yesterday. Had CT imaging reviewed. Identified diverticulitis flare and small abscess with fistula. Not amenable to drainage. They started her on Cipro + Flagyl for 2 weeks. Repeat CT in 2-3 weeks for abscess follow-up. Cipro/Flagyl for 14 days  Abdominal Pain GERD Worse with trigger foods. On Famotidine 20mg  BID and Omeprazole 20mg  daily, with improvement. Has 1 month fully. Admits nausea with x 1 vomiting but that has improved. Denies any dark stool or blood  Health Maintenance: Mammogram was ordered recently by Gen Surgery. Defer other screening until acute problems resolved and will have follow-up physical  Depression screen [facility] 2/9 04/17/2020  Decreased Interest 1  Down, Depressed, Hopeless 0  PHQ - 2 Score 1    Past Medical History:  Diagnosis Date  . GERD (gastroesophageal reflux disease)   . Kidney stones    Past Surgical History:  Procedure Laterality Date  . CESAREAN SECTION  1985   Social History   Socioeconomic History  . Marital status: Married    Spouse name: Not on file  . Number of children: Not on file  . Years of education: Not on file  . Highest education level: Not on file  Occupational History  . Not on file  Tobacco Use  . Smoking status: Current Every Day Smoker    Packs/day:  0.50    Types: Cigarettes  . Smokeless tobacco: Never Used  Vaping Use  . Vaping Use: Never used  Substance and Sexual Activity  . Alcohol use: Never  . Drug use: Never  . Sexual activity: Not on file  Other Topics Concern  . Not on file  Social History Narrative  . Not on file   Social Determinants of Health   Financial Resource Strain: Not on file  Food Insecurity: Not on file  Transportation Needs: Not on file  Physical Activity: Not on file  Stress: Not on file  Social Connections: Not on file  Intimate Partner Violence: Not on file   Family History  Problem Relation Age of Onset  . Diabetes Mother   . Diabetes Father    Current Outpatient Medications on File Prior to Visit  Medication Sig  . ciprofloxacin (CIPRO) 500 MG tablet Take 1 tablet (500 mg total) by mouth 2 (two) times daily for 14 days.  . famotidine (PEPCID) 20 MG tablet Take 1 tablet (20 mg total) by mouth 2 (two) times daily.  . metroNIDAZOLE (FLAGYL) 500 MG tablet Take 1 tablet (500 mg total) by mouth 3 (three) times daily for 14 days.  4/9 omeprazole (PRILOSEC) 20 MG capsule Take 1 capsule (20 mg total) by mouth daily.   No current facility-administered medications on file prior to visit.    Review of Systems Per HPI unless specifically indicated above      Objective:  BP 132/68 (BP Location: Left Arm, Patient Position: Sitting, Cuff Size: Normal)   Pulse 77   Temp 97.7 F (36.5 C) (Temporal)   Ht 5\' 1"  (1.549 m)   Wt 115 lb 3.2 oz (52.3 kg)   SpO2 99%   BMI 21.77 kg/m   Wt Readings from Last 3 Encounters:  04/17/20 115 lb 3.2 oz (52.3 kg)  04/16/20 115 lb 6.4 oz (52.3 kg)  04/15/20 115 lb (52.2 kg)    Physical Exam Vitals and nursing note reviewed.  Constitutional:      General: She is not in acute distress.    Appearance: She is well-developed. She is not diaphoretic.     Comments: Well-appearing, comfortable, cooperative  HENT:     Head: Normocephalic and atraumatic.  Eyes:      General:        Right eye: No discharge.        Left eye: No discharge.     Conjunctiva/sclera: Conjunctivae normal.  Cardiovascular:     Rate and Rhythm: Normal rate.  Pulmonary:     Effort: Pulmonary effort is normal.  Skin:    General: Skin is warm and dry.     Findings: No erythema or rash.  Neurological:     Mental Status: She is alert and oriented to person, place, and time.  Psychiatric:        Behavior: Behavior normal.     Comments: Well groomed, good eye contact, normal speech and thoughts       I have personally reviewed the radiology report from 04/15/20 CT.  CT HEMATURIA WORKUP [[id number]][[id number]] Resulted: 04/15/20 1222  Order Status: Completed Updated: 04/15/20 1224  Narrative:   CLINICAL DATA: Gross hematuria, weight loss   EXAM:  CT ABDOMEN AND PELVIS WITHOUT AND WITH CONTRAST   TECHNIQUE:  Multidetector CT imaging of the abdomen and pelvis was performed  following the standard protocol before and following the bolus  administration of intravenous contrast.   CONTRAST: 125mL OMNIPAQUE IOHEXOL 300 MG/ML SOLN   COMPARISON: None.   FINDINGS:  Lower chest: Lung bases are clear.   Hepatobiliary: Liver is within normal limits.   Gallbladder is unremarkable. No intrahepatic or extrahepatic ductal  dilatation.   Pancreas: Within normal limits.   Spleen: Within normal limits.   Adrenals/Urinary Tract: Low-density lesions in the bilateral renal  glands, measuring up to 19 mm on the left (series 4/image 15),  reflecting benign adrenal adenomas.   Numerous bilateral renal cysts, measuring up to 16 mm in the  anterior right upper kidney (series 13/image 25), benign (Bosniak  I). Additional scattered hyperdense lesions, including a 12 mm  hemorrhagic cyst in the lateral interpolar right kidney (series  2/image 35), benign (Bosniak II). No enhancing renal lesions.   Four nonobstructing right renal calculi measuring up to 3 mm.  Additional nonobstructing  left lower pole renal calculi and a  dominant 5 x 10 mm calculus in the left renal collecting system  (series 2/image 29). No ureteral or bladder calculi. No  hydronephrosis.   On delayed imaging, there are no filling defects in the bilateral  opacified proximal collecting systems, ureters, or bladder.   Bladder is within normal limits.   Stomach/Bowel: Stomach is within normal limits.   No evidence of bowel obstruction.   Appendix is not discretely visualized.   Wall thickening involving the rectosigmoid colon with 4.2 x 2.1 x  2.4 cm rim enhancing irregular fluid collection in the rectosigmoid  mesocolon (series 4/image 51)  and additional fistulous communication  in the left lower pelvis (series 4/image 56), reflecting a  colorectal fistula with abscess.   Vascular/Lymphatic: No evidence of abdominal aortic aneurysm.   Atherosclerotic calcifications of the abdominal aorta and branch  vessels.   No suspicious abdominopelvic lymphadenopathy.   Reproductive: Status post hysterectomy.   No adnexal masses.   Other: No abdominopelvic ascites.   Musculoskeletal: Visualized osseous structures are within normal  limits.   IMPRESSION:  10 x 5 mm calculus in the left renal collecting system. Additional  nonobstructing bilateral calculi. No hydronephrosis.   Numerous bilateral renal cysts, some of which are hemorrhagic,  benign (Bosniak I-II). No enhancing renal lesions.   Rectosigmoid wall thickening with associated colorectal fistula and  4.2 cm fluid collection/abscess along the rectosigmoid mesocolon, as  above.   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    Electronically Signed  By: Charline Bills M.D.  On: 04/15/2020 12:22      Results for orders placed or performed during the hospital encounter of 04/15/20  Urine Culture   Specimen: Urine, Random  Result Value Ref Range   Specimen Description      URINE, RANDOM Performed  at [facility], [address], [place], [place] [address]    Special Requests      NONE Performed at [facility], [address], [place], [place] [address]    Culture      NO GROWTH Performed at [facility], [address], [place], [place] [address]    Report Status 04/16/2020 FINAL   Urinalysis, Complete w Microscopic  Result Value Ref Range   Color, Urine YELLOW YELLOW   APPearance HAZY (A) CLEAR   Specific Gravity, Urine 1.025 1.005 - 1.030   pH 5.5 5.0 - 8.0   Glucose, UA NEGATIVE NEGATIVE mg/dL   Hgb urine dipstick MODERATE (A) NEGATIVE   Bilirubin Urine NEGATIVE NEGATIVE   Ketones, ur 15 (A) NEGATIVE mg/dL   Protein, ur NEGATIVE NEGATIVE mg/dL   Nitrite NEGATIVE NEGATIVE   Leukocytes,Ua TRACE (A) NEGATIVE   Squamous Epithelial / LPF 0-5 0 - 5   WBC, UA 0-5 0 - 5 WBC/hpf   RBC / HPF >50 0 - 5 RBC/hpf   Bacteria, UA NONE SEEN NONE SEEN   Budding Yeast PRESENT    Ca Oxalate Crys, UA PRESENT   CBC with Differential/Platelet  Result Value Ref Range   WBC 9.1 4.0 - 10.5 K/uL   RBC 4.21 3.87 - 5.11 MIL/uL   Hemoglobin 11.6 (L) 12.0 - 15.0 g/dL   HCT 86.7 67.2 - 09.4 %   MCV 86.0 80.0 - 100.0 fL   MCH 27.6 26.0 - 34.0 pg   MCHC 32.0 30.0 - 36.0 g/dL   RDW 70.9 (H) 62.8 - 36.6 %   Platelets 408 (H) 150 - 400 K/uL   nRBC 0.0 0.0 - 0.2 %   Neutrophils Relative % 73 %   Neutro Abs 6.7 1.7 - 7.7 K/uL   Lymphocytes Relative 20 %   Lymphs Abs 1.9 0.7 - 4.0 K/uL   Monocytes Relative 4 %   Monocytes Absolute 0.4 0.1 - 1.0 K/uL   Eosinophils Relative 1 %   Eosinophils Absolute 0.1 0.0 - 0.5 K/uL   Basophils Relative 1 %   Basophils Absolute 0.1 0.0 - 0.1 K/uL   Immature Granulocytes 1 %   Abs Immature Granulocytes 0.05 0.00 - 0.07 K/uL  Comprehensive metabolic  panel  Result Value Ref Range   Sodium 139 135 - 145 mmol/L   Potassium 3.7 3.5 - 5.1 mmol/L   Chloride 106 98 - 111 mmol/L   CO2 23 22 - 32 mmol/L   Glucose, Bld 96 70 - 99  mg/dL   BUN 22 (H) 6 - 20 mg/dL   Creatinine, Ser 9.56 0.44 - 1.00 mg/dL   Calcium 9.1 8.9 - 21.3 mg/dL   Total Protein 7.9 6.5 - 8.1 g/dL   Albumin 3.6 3.5 - 5.0 g/dL   AST 17 15 - 41 U/L   ALT 14 0 - 44 U/L   Alkaline Phosphatase 81 38 - 126 U/L   Total Bilirubin 0.3 0.3 - 1.2 mg/dL   GFR, Estimated >08 >65 mL/min   Anion gap 10 5 - 15      Assessment & Plan:   Problem List Items Addressed This Visit    GERD (gastroesophageal reflux disease) - Primary   Colovesical fistula    Other Visit Diagnoses    Diverticulitis       Gross hematuria       Nephrolithiasis          #GERD Continue current regimen H2 blocker and Omeprazole PPI 20mg  daily, she has rx for 1 month Encouraged diet lifestyle change avoid triggers Follow up within 4 weeks to continue therapy or adjust accordingly  #Diverticulitis  Colovesicula Fistula w/ abscess Now established w/ Gen Surgery on antibiotic course after CT images reviewed On Cipro + Flagyl, no changes today, continue current course 2 weeks Upcoming repeat imaging 4/28  #Gross Hematuria Nephrolithiasis Renal Cyst Followed by [facility] Continue w/ further work up Cystoscopy 4/21 and had CT already  Unclear history of HTN, normal BP today. Review records. No actual history of Diabetes as well, she has family history but not personal history  No orders of the defined types were placed in this encounter.    Follow up plan: Return in about 4 weeks (around 05/15/2020) for 4 weeks GERD med refill, specialist Uro/Surg updates.  07/15/2020, DO [facility] 04/17/2020, 10:18 AM

## 2020-04-17 NOTE — Patient Instructions (Addendum)
Thank you for coming to the office today.  Keep on current regimen as you are.  We can refill or update the Omeprazole prescription for stomach acid reflux at your next visit if you need it.  Continue antibiotics and keep following with specialists.  Please schedule a Follow-up Appointment to: Return in about 4 weeks (around 05/15/2020) for 4 weeks GERD med refill, specialist Uro/Surg updates.  If you have any other questions or concerns, please feel free to call the office or send a message through MyChart. You may also schedule an earlier appointment if necessary.  Additionally, you may be receiving a survey about your experience at our office within a few days to 1 week by e-mail or mail. We value your feedback.  Saralyn Pilar, DO [facility], [place]

## 2020-04-24 ENCOUNTER — Other Ambulatory Visit: Payer: Self-pay

## 2020-04-24 ENCOUNTER — Ambulatory Visit (INDEPENDENT_AMBULATORY_CARE_PROVIDER_SITE_OTHER): Payer: Self-pay | Admitting: Urology

## 2020-04-24 ENCOUNTER — Encounter: Payer: Self-pay | Admitting: Urology

## 2020-04-24 VITALS — BP 172/85 | HR 87 | Ht 61.0 in | Wt 115.0 lb

## 2020-04-24 DIAGNOSIS — N200 Calculus of kidney: Secondary | ICD-10-CM

## 2020-04-24 DIAGNOSIS — R310 Gross hematuria: Secondary | ICD-10-CM

## 2020-04-24 DIAGNOSIS — R399 Unspecified symptoms and signs involving the genitourinary system: Secondary | ICD-10-CM

## 2020-04-24 MED ORDER — LIDOCAINE HCL URETHRAL/MUCOSAL 2 % EX GEL
1.0000 "application " | Freq: Once | CUTANEOUS | Status: AC
  Administered 2020-04-24: 1 via URETHRAL

## 2020-04-24 NOTE — Progress Notes (Signed)
Cystoscopy Procedure Note:  Indication: Gross hematuria  After informed consent and discussion of the procedure and its risks, RAMANDA PAULES was positioned and prepped in the standard fashion. Cystoscopy was performed with a flexible cystoscope. The urethra, bladder neck and entire bladder was visualized in a standard fashion.  The bladder mucosa was grossly normal throughout with no abnormalities.  The ureteral orifices were visualized in their normal location and orientation.  No abnormalities on retroflexion.  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  No gas in the bladder.  I personally reviewed the CT today, and I also feel there is a 4 mm left distal ureteral stone without significant hydronephrosis.  This is the likely etiology of her gross hematuria, and potentially her abdominal pain in addition to her abscess.  This was discussed with radiology, they are in agreement, and they will addend the read.  Findings: Normal cystoscopy today  -----------------------------------------------------------------------  Assessment and Plan: Complex 60 year old female with lower abdominal pain and gross hematuria.  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 Everlene Farrier in general surgery on 04/16/2020 and he started Cipro and Flagyl, with plan for colonoscopy in the future to rule out malignancy, with likely ultimate need for sigmoid colectomy in the future.  Her cystoscopy today is completely normal, and I do not appreciate any colovesical fistula.  On my review of the CT she also appears to have a 4 mm left distal ureteral stone without significant hydronephrosis, that likely is contributing to the prior gross hematuria, and the lower abdominal pain.  This would have a  > 90% spontaneous passage rate.  Persistent  microscopic hematuria today which may indicate persistent stone.  Renal long conversation today about her left distal ureteral stone as well as the larger upstream left renal stone.  Her pain is currently well controlled.  I think it is reasonable for a trial of Flomax 0.4 mg nightly to see if she can pass the stone spontaneously, with low threshold for intervention with left ureteroscopy, laser lithotripsy, and stent placement.  We could manage the large left renal stone as well at the same time at that procedure.  Flomax sent to pharmacy.  Return precautions discussed extensively including fever/chills, or left renal colic.  She is a very complex patient with her large rectosigmoid abscess in addition to the small left distal ureteral stone and large left renal stone.  -Continue antibiotics per general surgery for rectosigmoid abscess and colorectal fistula -No evidence of any bladder abnormalities on cystoscopy today -Trial of Flomax for small left distal ureteral stone, discussed with radiology and they will addend the read.  Low threshold for left ureteroscopy, laser lithotripsy, stent placement to address both stones -Call with urine culture and cytology results  I spent 35 total minutes on the day of the encounter in addition to the cystoscopy including pre-visit review of the medical record, face-to-face time with the patient, and post visit ordering of labs/imaging/tests.   Legrand Rams, MD 04/24/2020

## 2020-04-25 ENCOUNTER — Telehealth: Payer: Self-pay

## 2020-04-25 DIAGNOSIS — N200 Calculus of kidney: Secondary | ICD-10-CM

## 2020-04-25 LAB — URINALYSIS, COMPLETE
Bilirubin, UA: NEGATIVE
Glucose, UA: NEGATIVE
Ketones, UA: NEGATIVE
Nitrite, UA: NEGATIVE
Protein,UA: NEGATIVE
Specific Gravity, UA: 1.025 (ref 1.005–1.030)
Urobilinogen, Ur: 0.2 mg/dL (ref 0.2–1.0)
pH, UA: 5.5 (ref 5.0–7.5)

## 2020-04-25 LAB — MICROSCOPIC EXAMINATION

## 2020-04-25 MED ORDER — TAMSULOSIN HCL 0.4 MG PO CAPS: 0.4000 mg | ORAL_CAPSULE | Freq: Every day | ORAL | 0 refills | Status: DC

## 2020-04-25 NOTE — Telephone Encounter (Signed)
Please change Stall follow-up to Tuesday or Wednesday next week with KUB prior and UA, thanks.  Called pt, informed her of the information above. Appt rescheduled. Flomax sent in, KUB ordered. Pt voiced understanding.

## 2020-04-27 LAB — CULTURE, URINE COMPREHENSIVE

## 2020-04-28 ENCOUNTER — Ambulatory Visit: Payer: Self-pay | Admitting: Surgery

## 2020-04-28 LAB — CYTOLOGY - NON PAP

## 2020-04-29 LAB — CULTURE, URINE COMPREHENSIVE

## 2020-04-30 ENCOUNTER — Ambulatory Visit
Admission: RE | Admit: 2020-04-30 | Discharge: 2020-04-30 | Disposition: A | Discharge status: Home / Self Care | Payer: Self-pay | Source: Ambulatory Visit | Attending: Urology | Admitting: Urology

## 2020-04-30 ENCOUNTER — Ambulatory Visit (INDEPENDENT_AMBULATORY_CARE_PROVIDER_SITE_OTHER): Payer: Self-pay | Admitting: Urology

## 2020-04-30 ENCOUNTER — Other Ambulatory Visit: Payer: Self-pay | Admitting: Urology

## 2020-04-30 ENCOUNTER — Encounter: Payer: Self-pay | Admitting: Urology

## 2020-04-30 ENCOUNTER — Ambulatory Visit
Admission: RE | Admit: 2020-04-30 | Discharge: 2020-04-30 | Disposition: A | Discharge status: Home / Self Care | Payer: Self-pay | Source: Ambulatory Visit | Attending: Surgery | Admitting: Surgery

## 2020-04-30 ENCOUNTER — Other Ambulatory Visit: Payer: Self-pay

## 2020-04-30 VITALS — BP 188/91 | HR 93 | Ht 61.0 in | Wt 115.0 lb

## 2020-04-30 DIAGNOSIS — R399 Unspecified symptoms and signs involving the genitourinary system: Secondary | ICD-10-CM

## 2020-04-30 DIAGNOSIS — J438 Other emphysema: Secondary | ICD-10-CM | POA: Insufficient documentation

## 2020-04-30 DIAGNOSIS — R310 Gross hematuria: Secondary | ICD-10-CM | POA: Insufficient documentation

## 2020-04-30 DIAGNOSIS — N200 Calculus of kidney: Secondary | ICD-10-CM

## 2020-04-30 NOTE — Progress Notes (Signed)
   04/30/2020 3:43 PM   Teresa Salazar Jul 17, 1960 [id number]  Reason for visit: Follow up gross hematuria, nephrolithiasis  HPI: Complex 60 year old female who has had lower abdominal pain, weight loss, gross hematuria over the last few weeks.  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  CT also showed a large 1 cm left renal pelvis stone, as well as a smaller 4 mm left distal ureteral stone with minimal hydronephrosis.  She opted for a trial of medical expulsive therapy.  Urine cultures have showed no growth.  She continues to report some low to mid abdominal pain.  She denies any fevers or chills.  Urinalysis today consistent with persistent ureteral stone with 11-30 WBCs, greater than 30 RBCs, few bacteria, nitrite negative, no leukocytes.  I personally reviewed her KUB today, and it actually looks like her large 1 cm left renal pelvis stone has migrated down to the left mid to distal ureter.  I recommended proceeding with left ureteroscopy, laser lithotripsy, and stent placement this week.  She has a CT tomorrow to evaluate her rectosigmoid abscess, and this will confirm presence of the left ureteral stone, and I will call her with those results.  Schedule left ureteroscopy, laser lithotripsy, stent placement this Friday  Sondra Come, MD  [facility] [address], [address] [place], [place] [address] [phone]

## 2020-04-30 NOTE — Patient Instructions (Signed)
Ureteral Stent Implantation  Ureteral stent implantation is a procedure to insert (implant) a flexible, soft, plastic tube (stent) into a ureter. Ureters are the tube-like parts of the body that drain urine from the kidneys. The stent supports the ureter while it heals and helps to drain urine. You may have a ureteral stent implanted after having a procedure to remove a blockage from the ureter (ureterolysis or pyeloplasty). You may also have a stent implanted to open the flow of urine when you have a blockage caused by a kidney stone, tumor, blood clot, or infection. You have two ureters, one on each side of the body. The ureters connect the kidneys to the organ that holds urine until it passes out of the body (bladder). The stent is placed so that one end is in the kidney, and one end is in the bladder. The stent is usually taken out after your ureter has healed. Depending on your condition, you may have a stent for just a few weeks, or you may have a long-term stent that will need to be replaced every few months. Tell a health care provider about:  Any allergies you have.  All medicines you are taking, including vitamins, herbs, eye drops, creams, and over-the-counter medicines.  Any problems you or family members have had with anesthetic medicines.  Any blood disorders you have.  Any surgeries you have had.  Any medical conditions you have.  Whether you are pregnant or may be pregnant. What are the risks? Generally, this is a safe procedure. However, problems may occur, including:  Infection.  Bleeding.  Allergic reactions to medicines.  Damage to other structures or organs. Tearing (perforation) of the ureter is possible.  Movement of the stent away from where it is placed during surgery (migration). What happens before the procedure? Medicines Ask your health care provider about:  Changing or stopping your regular medicines. This is especially important if you are taking  diabetes medicines or blood thinners.  Taking medicines such as aspirin and ibuprofen. These medicines can thin your blood. Do not take these medicines unless your health care provider tells you to take them.  Taking over-the-counter medicines, vitamins, herbs, and supplements. Eating and drinking Follow instructions from your health care provider about eating and drinking, which may include:  8 hours before the procedure - stop eating heavy meals or foods, such as meat, fried foods, or fatty foods.  6 hours before the procedure - stop eating light meals or foods, such as toast or cereal.  6 hours before the procedure - stop drinking milk or drinks that contain milk.  2 hours before the procedure - stop drinking clear liquids. Staying hydrated Follow instructions from your health care provider about hydration, which may include:  Up to 2 hours before the procedure - you may continue to drink clear liquids, such as water, clear fruit juice, black coffee, and plain tea. General instructions  Do not drink alcohol.  Do not use any products that contain nicotine or tobacco for at least 4 weeks before the procedure. These products include cigarettes, e-cigarettes, and chewing tobacco. If you need help quitting, ask your health care provider.  You may have an exam or testing, such as imaging or blood tests.  Ask your health care provider what steps will be taken to help prevent infection. These may include: ? Removing hair at the surgery site. ? Washing skin with a germ-killing soap. ? Taking antibiotic medicine.  Plan to have someone take you home   from the hospital or clinic.  If you will be going home right after the procedure, plan to have someone with you for 24 hours. What happens during the procedure?  An IV will be inserted into one of your veins.  You may be given a medicine to help you relax (sedative).  You may be given a medicine to make you fall asleep (general  anesthetic).  A thin, tube-shaped instrument with a light and tiny camera at the end (cystoscope) will be inserted into your urethra. The urethra is the tube that drains urine from the bladder out of the body. In men, the urethra opens at the end of the penis. In women, the urethra opens in front of the vaginal opening.  The cystoscope will be passed into your bladder.  A thin wire (guide wire) will be passed through your bladder and into your ureter. This is used to guide the stent into your ureter.  The stent will be inserted into your ureter.  The guide wire and the cystoscope will be removed.  A flexible tube (catheter) may be inserted through your urethra so that one end is in your bladder. This helps to drain urine from your bladder. The procedure may vary among hospitals and health care providers. What happens after the procedure?  Your blood pressure, heart rate, breathing rate, and blood oxygen level will be monitored until you leave the hospital or clinic.  You may continue to receive medicine and fluids through an IV.  You may have some soreness or pain in your abdomen and urethra. Medicines will be available to help you.  You will be encouraged to get up and walk around as soon as you can.  You may have a catheter draining your urine.  You will have some blood in your urine.  Do not drive for 24 hours if you were given a sedative during your procedure. Summary  Ureteral stent implantation is a procedure to insert a flexible, soft, plastic tube (stent) into a ureter.  You may have a stent implanted to support the ureter while it heals after a procedure or to open the flow of urine if there is a blockage.  Follow instructions from your health care provider about taking medicines and about eating and drinking before the procedure.  Depending on your condition, you may have a stent for just a few weeks, or you may have a long-term stent that will need to be replaced every  few months. This information is not intended to replace advice given to you by your health care provider. Make sure you discuss any questions you have with your health care provider. Document Revised: 09/27/2017 Document Reviewed: 09/28/2017 Elsevier Patient Education  2021 [employer] Laser Therapy for Kidney Stones Laser therapy for kidney stones is a procedure to break up small, hard mineral deposits that form in the kidney (kidney stones). The procedure is done using a device that produces a focused beam of light (laser). The laser breaks up kidney stones into pieces that are small enough to be passed out of the body through urination or removed from the body during the procedure. You may need laser therapy if you have kidney stones that are painful or block your urinary tract. This procedure is done by inserting a tube (ureteroscope) into your kidney through the urethral opening. The urethra is the part of the body that drains urine from the bladder. In women, the urethra opens above the vaginal opening. In men, the urethra opens   at the tip of the penis. The ureteroscope is inserted through the urethra, and surgical instruments are moved through the bladder and the muscular tube that connects the kidney to the bladder (ureter) until they reach the kidney. Tell a health care provider about:  Any allergies you have.  All medicines you are taking, including vitamins, herbs, eye drops, creams, and over-the-counter medicines.  Any problems you or family members have had with anesthetic medicines.  Any blood disorders you have.  Any surgeries you have had.  Any medical conditions you have.  Whether you are pregnant or may be pregnant. What are the risks? Generally, this is a safe procedure. However, problems may occur, including:  Infection.  Bleeding.  Allergic reactions to medicines.  Damage to the urethra, bladder, or ureter.  Urinary tract infection (UTI).  Narrowing of the  urethra (urethral stricture).  Difficulty passing urine.  Blockage of the kidney caused by a fragment of kidney stone. What happens before the procedure? Medicines  Ask your health care provider about: ? Changing or stopping your regular medicines. This is especially important if you are taking diabetes medicines or blood thinners. ? Taking medicines such as aspirin and ibuprofen. These medicines can thin your blood. Do not take these medicines unless your health care provider tells you to take them. ? Taking over-the-counter medicines, vitamins, herbs, and supplements. Eating and drinking Follow instructions from your health care provider about eating and drinking, which may include:  8 hours before the procedure - stop eating heavy meals or foods, such as meat, fried foods, or fatty foods.  6 hours before the procedure - stop eating light meals or foods, such as toast or cereal.  6 hours before the procedure - stop drinking milk or drinks that contain milk.  2 hours before the procedure - stop drinking clear liquids. Staying hydrated Follow instructions from your health care provider about hydration, which may include:  Up to 2 hours before the procedure - you may continue to drink clear liquids, such as water, clear fruit juice, black coffee, and plain tea.   General instructions  You may have a physical exam before the procedure. You may also have tests, such as imaging tests and blood or urine tests.  If your ureter is too narrow, your health care provider may place a soft, flexible tube (stent) inside of it. The stent may be placed days or weeks before your laser therapy procedure.  Plan to have someone take you home from the hospital or clinic.  If you will be going home right after the procedure, plan to have someone stay with you for 24 hours.  Do not use any products that contain nicotine or tobacco for at least 4 weeks before the procedure. These products include  cigarettes, e-cigarettes, and chewing tobacco. If you need help quitting, ask your health care provider.  Ask your health care provider: ? How your surgical site will be marked or identified. ? What steps will be taken to help prevent infection. These may include:  Removing hair at the surgery site.  Washing skin with a germ-killing soap.  Taking antibiotic medicine. What happens during the procedure?  An IV will be inserted into one of your veins.  You will be given one or more of the following: ? A medicine to help you relax (sedative). ? A medicine to numb the area (local anesthetic). ? A medicine to make you fall asleep (general anesthetic).  A ureteroscope will be inserted into your   urethra. The ureteroscope will send images to a video screen in the operating room to guide your surgeon to the area of your kidney that will be treated.  A small, flexible tube will be threaded through the ureteroscope and into your bladder and ureter, up to your kidney.  The laser device will be inserted into your kidney through the tube. Your surgeon will pulse the laser on and off to break up kidney stones.  A surgical instrument that has a tiny wire basket may be inserted through the tube into your kidney to remove the pieces of broken kidney stone. The procedure may vary among health care providers and hospitals.   What happens after the procedure?  Your blood pressure, heart rate, breathing rate, and blood oxygen level will be monitored until you leave the hospital or clinic.  You will be given pain medicine as needed.  You may continue to receive antibiotics.  You may have a stent temporarily placed in your ureter.  Do not drive for 24 hours if you were given a sedative during your procedure.  You may be given a strainer to collect any stone fragments that you pass in your urine. Your health care provider may have these tested. Summary  Laser therapy for kidney stones is a procedure  to break up kidney stones into pieces that are small enough to be passed out of the body through urination or removed during the procedure.  Follow instructions from your health care provider about eating and drinking before the procedure.  During the procedure, the ureteroscope will send images to a video screen to guide your surgeon to the area of your kidney that will be treated.  Do not drive for 24 hours if you were given a sedative during your procedure. This information is not intended to replace advice given to you by your health care provider. Make sure you discuss any questions you have with your health care provider. Document Revised: 09/01/2017 Document Reviewed: 09/01/2017 Elsevier Patient Education  2021 [employer] Inc.  

## 2020-04-30 NOTE — Addendum Note (Signed)
Addended by: Frankey Shown on: 04/30/2020 04:03 PM   Modules accepted: Orders

## 2020-05-01 ENCOUNTER — Telehealth: Payer: Self-pay

## 2020-05-01 ENCOUNTER — Encounter
Admission: RE | Admit: 2020-05-01 | Discharge: 2020-05-01 | Disposition: A | Discharge status: Home / Self Care | Payer: Self-pay | Source: Ambulatory Visit | Attending: Urology | Admitting: Urology

## 2020-05-01 ENCOUNTER — Other Ambulatory Visit
Admission: RE | Admit: 2020-05-01 | Discharge: 2020-05-01 | Disposition: A | Discharge status: Home / Self Care | Payer: Self-pay | Source: Ambulatory Visit | Attending: Urology | Admitting: Urology

## 2020-05-01 ENCOUNTER — Ambulatory Visit
Admission: RE | Admit: 2020-05-01 | Discharge: 2020-05-01 | Disposition: A | Discharge status: Home / Self Care | Payer: Self-pay | Source: Ambulatory Visit | Attending: Surgery | Admitting: Surgery

## 2020-05-01 DIAGNOSIS — L988 Other specified disorders of the skin and subcutaneous tissue: Secondary | ICD-10-CM | POA: Insufficient documentation

## 2020-05-01 DIAGNOSIS — Z20822 Contact with and (suspected) exposure to covid-19: Secondary | ICD-10-CM | POA: Insufficient documentation

## 2020-05-01 DIAGNOSIS — Z01812 Encounter for preprocedural laboratory examination: Secondary | ICD-10-CM | POA: Insufficient documentation

## 2020-05-01 HISTORY — DX: Personal history of urinary calculi: Z87.442

## 2020-05-01 HISTORY — DX: Elevated blood-pressure reading, without diagnosis of hypertension: R03.0

## 2020-05-01 HISTORY — DX: Diverticulitis of intestine, part unspecified, without perforation or abscess without bleeding: K57.92

## 2020-05-01 LAB — SARS CORONAVIRUS 2 (TAT 6-24 HRS): SARS Coronavirus 2: NEGATIVE

## 2020-05-01 MED ORDER — IOHEXOL 300 MG/ML  SOLN
75.0000 mL | Freq: Once | INTRAMUSCULAR | Status: AC | PRN
  Administered 2020-05-01: 75 mL via INTRAVENOUS

## 2020-05-01 NOTE — Patient Instructions (Addendum)
Your procedure is scheduled on:05-02-20 FRIDAY Report to the Registration Desk on the 1st floor of the Medical Mall-Then proceed to the 2nd floor Surgery Desk in the Medical Mall To find out your arrival time, please call [phone] between 1PM - 3PM on:05-01-20 THURSDAY  REMEMBER: Instructions that are not followed completely may result in serious medical risk, up to and including death; or upon the discretion of your surgeon and anesthesiologist your surgery may need to be rescheduled.  Do not eat food or drink liquids after midnight the night before surgery.  No gum chewing, lozengers or hard candies.Marland Kitchen  TAKE THESE MEDICATIONS THE MORNING OF SURGERY WITH A SIP OF WATER: -PEPCID (FAMOTIDINE) -PRILOSEC (OMEPRAZOLE) -FLOMAX (TAMSULOSIN)  One week prior to surgery: Stop Anti-inflammatories (NSAIDS) such as Advil, Aleve, Ibuprofen, Motrin, Naproxen, Naprosyn and Aspirin based products such as Excedrin, Goodys Powder, BC Powder-OK TO TAKE TYLENOL IF NEEDED  Stop ANY OVER THE COUNTER supplements until after surgery.  No Alcohol for 24 hours before or after surgery.  No Smoking including e-cigarettes for 24 hours prior to surgery.  No chewable tobacco products for at least 6 hours prior to surgery.  No nicotine patches on the day of surgery.  Do not use any "recreational" drugs for at least a week prior to your surgery.  Please be advised that the combination of cocaine and anesthesia may have negative outcomes, up to and including death. If you test positive for cocaine, your surgery will be cancelled.  On the morning of surgery brush your teeth with toothpaste and water, you may rinse your mouth with mouthwash if you wish. Do not swallow any toothpaste or mouthwash.  Do not wear jewelry, make-up, hairpins, clips or nail polish.  Do not wear lotions, powders, or perfumes.   Do not shave body from the neck down 48 hours prior to surgery just in case you cut yourself which could leave  a site for infection.  Also, freshly shaved skin may become irritated if using the CHG soap.  Contact lenses, hearing aids and dentures may not be worn into surgery.  Do not bring valuables to the hospital. [facility] is not responsible for any missing/lost belongings or valuables.   Notify your doctor if there is any change in your medical condition (cold, fever, infection).  Wear comfortable clothing (specific to your surgery type) to the hospital.  Plan for stool softeners for home use; pain medications have a tendency to cause constipation. You can also help prevent constipation by eating foods high in fiber such as fruits and vegetables and drinking plenty of fluids as your diet allows.  After surgery, you can help prevent lung complications by doing breathing exercises.  Take deep breaths and cough every 1-2 hours. Your doctor may order a device called an Incentive Spirometer to help you take deep breaths. When coughing or sneezing, hold a pillow firmly against your incision with both hands. This is called "splinting." Doing this helps protect your incision. It also decreases belly discomfort.  If you are being admitted to the hospital overnight, leave your suitcase in the car. After surgery it may be brought to your room.  If you are being discharged the day of surgery, you will not be allowed to drive home. You will need a responsible adult (18 years or older) to drive you home and stay with you that night.   If you are taking public transportation, you will need to have a responsible adult (18 years or older) with  you. Please confirm with your physician that it is acceptable to use public transportation.   Please call the Pre-admissions Testing Dept. at [phone] if you have any questions about these instructions.  Surgery Visitation Policy:  Patients undergoing a surgery or procedure may have one family member or support person with them as long as that person is not  COVID-19 positive or experiencing its symptoms.  That person may remain in the waiting area during the procedure.  Inpatient Visitation:    Visiting hours are 7 a.m. to 8 p.m. Inpatients will be allowed two visitors daily. The visitors may change each day during the patient's stay. No visitors under the age of 75. Any visitor under the age of 106 must be accompanied by an adult. The visitor must pass COVID-19 screenings, use hand sanitizer when entering and exiting the patient's room and wear a mask at all times, including in the patient's room. Patients must also wear a mask when staff or their visitor are in the room. Masking is required regardless of vaccination status.

## 2020-05-01 NOTE — Telephone Encounter (Signed)
-----   Message from Sondra Come, MD sent at 05/01/2020  4:14 PM EDT ----- Regarding: Confirm surgery I reviewed her CT from today, and her large stone is still stuck in the ureter on the left side.  Proceed with ureteroscopy as scheduled tomorrow for stone removal  Legrand Rams, MD 05/01/2020

## 2020-05-01 NOTE — Telephone Encounter (Signed)
Patient notified

## 2020-05-02 ENCOUNTER — Ambulatory Visit: Payer: Self-pay | Admitting: Certified Registered Nurse Anesthetist

## 2020-05-02 ENCOUNTER — Ambulatory Visit
Admission: RE | Admit: 2020-05-02 | Discharge: 2020-05-02 | Disposition: A | Discharge status: Home / Self Care | Payer: Self-pay | Attending: Urology | Admitting: Urology

## 2020-05-02 ENCOUNTER — Other Ambulatory Visit: Payer: Self-pay

## 2020-05-02 ENCOUNTER — Encounter: Payer: Self-pay | Admitting: Urology

## 2020-05-02 ENCOUNTER — Ambulatory Visit: Payer: Self-pay

## 2020-05-02 ENCOUNTER — Encounter
Admission: RE | Disposition: A | Discharge status: Home / Self Care | Payer: Self-pay | Source: Home / Self Care | Attending: Urology

## 2020-05-02 DIAGNOSIS — I10 Essential (primary) hypertension: Secondary | ICD-10-CM | POA: Insufficient documentation

## 2020-05-02 DIAGNOSIS — Z6821 Body mass index (BMI) 21.0-21.9, adult: Secondary | ICD-10-CM | POA: Insufficient documentation

## 2020-05-02 DIAGNOSIS — F17210 Nicotine dependence, cigarettes, uncomplicated: Secondary | ICD-10-CM | POA: Insufficient documentation

## 2020-05-02 DIAGNOSIS — R310 Gross hematuria: Secondary | ICD-10-CM | POA: Insufficient documentation

## 2020-05-02 DIAGNOSIS — R634 Abnormal weight loss: Secondary | ICD-10-CM | POA: Insufficient documentation

## 2020-05-02 DIAGNOSIS — N201 Calculus of ureter: Secondary | ICD-10-CM | POA: Insufficient documentation

## 2020-05-02 DIAGNOSIS — Z833 Family history of diabetes mellitus: Secondary | ICD-10-CM | POA: Insufficient documentation

## 2020-05-02 DIAGNOSIS — N200 Calculus of kidney: Secondary | ICD-10-CM

## 2020-05-02 HISTORY — PX: CYSTOSCOPY/URETEROSCOPY/HOLMIUM LASER/STENT PLACEMENT: SHX6546

## 2020-05-02 LAB — URINALYSIS, COMPLETE
Bilirubin, UA: NEGATIVE
Glucose, UA: NEGATIVE
Ketones, UA: NEGATIVE
Leukocytes,UA: NEGATIVE
Nitrite, UA: NEGATIVE
Specific Gravity, UA: >1.03 — ABNORMAL HIGH (ref 1.005–1.030)
Urobilinogen, Ur: 0.2 mg/dL (ref 0.2–1.0)
pH, UA: 5 (ref 5.0–7.5)

## 2020-05-02 LAB — MICROSCOPIC EXAMINATION: RBC, Urine: >30 /hpf — AB (ref 0–2)

## 2020-05-02 SURGERY — CYSTOSCOPY/URETEROSCOPY/HOLMIUM LASER/STENT PLACEMENT
Anesthesia: General | Laterality: Left

## 2020-05-02 MED ORDER — LACTATED RINGERS IV SOLN: INTRAVENOUS | Status: DC | PRN

## 2020-05-02 MED ORDER — PROPOFOL 10 MG/ML IV BOLUS
INTRAVENOUS | Status: DC | PRN
  Administered 2020-05-02: 120 mg via INTRAVENOUS

## 2020-05-02 MED ORDER — OXYCODONE HCL 5 MG/5ML PO SOLN: 5.0000 mg | Freq: Once | ORAL | Status: DC | PRN

## 2020-05-02 MED ORDER — ORAL CARE MOUTH RINSE: 15.0000 mL | Freq: Once | OROMUCOSAL | Status: DC

## 2020-05-02 MED ORDER — FENTANYL CITRATE (PF) 100 MCG/2ML IJ SOLN: 25.0000 ug | INTRAMUSCULAR | Status: DC | PRN

## 2020-05-02 MED ORDER — SUGAMMADEX SODIUM 200 MG/2ML IV SOLN
INTRAVENOUS | Status: DC | PRN
  Administered 2020-05-02: 200 mg via INTRAVENOUS

## 2020-05-02 MED ORDER — MIDAZOLAM HCL 2 MG/2ML IJ SOLN
INTRAMUSCULAR | Status: DC | PRN
  Administered 2020-05-02: 2 mg via INTRAVENOUS

## 2020-05-02 MED ORDER — ACETAMINOPHEN 10 MG/ML IV SOLN
INTRAVENOUS | Status: DC | PRN
  Administered 2020-05-02: 1000 mg via INTRAVENOUS

## 2020-05-02 MED ORDER — ONDANSETRON HCL 4 MG/2ML IJ SOLN
INTRAMUSCULAR | Status: DC | PRN
  Administered 2020-05-02: 4 mg via INTRAVENOUS

## 2020-05-02 MED ORDER — CHLORHEXIDINE GLUCONATE 0.12 % MT SOLN: 15.0000 mL | Freq: Once | OROMUCOSAL | Status: DC

## 2020-05-02 MED ORDER — PHENYLEPHRINE HCL (PRESSORS) 10 MG/ML IV SOLN
INTRAVENOUS | Status: DC | PRN
  Administered 2020-05-02: 100 ug via INTRAVENOUS

## 2020-05-02 MED ORDER — FENTANYL CITRATE (PF) 100 MCG/2ML IJ SOLN
INTRAMUSCULAR | Status: DC | PRN
  Administered 2020-05-02: 50 ug via INTRAVENOUS

## 2020-05-02 MED ORDER — ROCURONIUM BROMIDE 100 MG/10ML IV SOLN
INTRAVENOUS | Status: DC | PRN
  Administered 2020-05-02: 10 mg via INTRAVENOUS
  Administered 2020-05-02: 30 mg via INTRAVENOUS

## 2020-05-02 MED ORDER — CEFAZOLIN SODIUM-DEXTROSE 2-4 GM/100ML-% IV SOLN
INTRAVENOUS | Status: AC
  Filled 2020-05-02: qty 100

## 2020-05-02 MED ORDER — EPHEDRINE SULFATE 50 MG/ML IJ SOLN
INTRAMUSCULAR | Status: DC | PRN
  Administered 2020-05-02: 5 mg via INTRAVENOUS

## 2020-05-02 MED ORDER — MIDAZOLAM HCL 2 MG/2ML IJ SOLN
INTRAMUSCULAR | Status: AC
  Filled 2020-05-02: qty 2

## 2020-05-02 MED ORDER — FENTANYL CITRATE (PF) 100 MCG/2ML IJ SOLN
INTRAMUSCULAR | Status: AC
  Filled 2020-05-02: qty 2

## 2020-05-02 MED ORDER — IOHEXOL 180 MG/ML  SOLN
INTRAMUSCULAR | Status: DC | PRN
  Administered 2020-05-02: 6 mL

## 2020-05-02 MED ORDER — BELLADONNA ALKALOIDS-OPIUM 16.2-60 MG RE SUPP
RECTAL | Status: AC
  Filled 2020-05-02: qty 1

## 2020-05-02 MED ORDER — DEXAMETHASONE SODIUM PHOSPHATE 10 MG/ML IJ SOLN
INTRAMUSCULAR | Status: DC | PRN
  Administered 2020-05-02: 10 mg via INTRAVENOUS

## 2020-05-02 MED ORDER — MEPERIDINE HCL 50 MG/ML IJ SOLN: 6.2500 mg | INTRAMUSCULAR | Status: DC | PRN

## 2020-05-02 MED ORDER — OXYCODONE HCL 5 MG PO TABS: 5.0000 mg | ORAL_TABLET | Freq: Once | ORAL | Status: DC | PRN

## 2020-05-02 MED ORDER — LACTATED RINGERS IV SOLN: INTRAVENOUS | Status: DC

## 2020-05-02 MED ORDER — SODIUM CHLORIDE 0.9 % IV SOLN
INTRAVENOUS | Status: DC | PRN
  Administered 2020-05-02: 40 ug/min via INTRAVENOUS

## 2020-05-02 MED ORDER — HYDROCODONE-ACETAMINOPHEN 5-325 MG PO TABS: 1.0000 | ORAL_TABLET | Freq: Four times a day (QID) | ORAL | 0 refills | Status: AC | PRN

## 2020-05-02 MED ORDER — SODIUM CHLORIDE FLUSH 0.9 % IV SOLN
INTRAVENOUS | Status: AC
  Filled 2020-05-02: qty 10

## 2020-05-02 MED ORDER — PROMETHAZINE HCL 25 MG/ML IJ SOLN: 6.2500 mg | INTRAMUSCULAR | Status: DC | PRN

## 2020-05-02 MED ORDER — BELLADONNA ALKALOIDS-OPIUM 16.2-60 MG RE SUPP
RECTAL | Status: DC | PRN
  Administered 2020-05-02: 1 via RECTAL

## 2020-05-02 MED ORDER — LIDOCAINE HCL (CARDIAC) PF 100 MG/5ML IV SOSY
PREFILLED_SYRINGE | INTRAVENOUS | Status: DC | PRN
  Administered 2020-05-02: 50 mg via INTRAVENOUS

## 2020-05-02 MED ORDER — CEFAZOLIN SODIUM-DEXTROSE 2-4 GM/100ML-% IV SOLN
2.0000 g | INTRAVENOUS | Status: AC
  Administered 2020-05-02: 2 g via INTRAVENOUS

## 2020-05-02 SURGICAL SUPPLY — 31 items
BAG DRAIN CYSTO-URO LG1000N (MISCELLANEOUS) ×2 IMPLANT
BASKET ZERO TIP 1.9FR (BASKET) ×2 IMPLANT
BRUSH SCRUB EZ 1% IODOPHOR (MISCELLANEOUS) ×2 IMPLANT
BSKT STON RTRVL ZERO TP 1.9FR (BASKET) ×1
CATH URET FLEX-TIP 2 LUMEN 10F (CATHETERS) IMPLANT
CATH URETL 5X70 OPEN END (CATHETERS) IMPLANT
CNTNR SPEC 2.5X3XGRAD LEK (MISCELLANEOUS) ×1
CONT SPEC 4OZ STER OR WHT (MISCELLANEOUS) ×1
CONT SPEC 4OZ STRL OR WHT (MISCELLANEOUS) ×1
CONTAINER SPEC 2.5X3XGRAD LEK (MISCELLANEOUS) ×1 IMPLANT
DRAPE UTILITY 15X26 TOWEL STRL (DRAPES) ×2 IMPLANT
DRSG TEGADERM 2-3/8X2-3/4 SM (GAUZE/BANDAGES/DRESSINGS) ×2 IMPLANT
GLOVE SURG UNDER POLY LF SZ7.5 (GLOVE) ×2 IMPLANT
GOWN STRL REUS W/ TWL LRG LVL3 (GOWN DISPOSABLE) ×1 IMPLANT
GOWN STRL REUS W/ TWL XL LVL3 (GOWN DISPOSABLE) ×1 IMPLANT
GOWN STRL REUS W/TWL LRG LVL3 (GOWN DISPOSABLE) ×2
GOWN STRL REUS W/TWL XL LVL3 (GOWN DISPOSABLE) ×2
GUIDEWIRE STR DUAL SENSOR (WIRE) ×2 IMPLANT
INFUSOR MANOMETER BAG 3000ML (MISCELLANEOUS) ×2 IMPLANT
IV NS IRRIG 3000ML ARTHROMATIC (IV SOLUTION) ×2 IMPLANT
KIT TURNOVER CYSTO (KITS) ×2 IMPLANT
PACK CYSTO AR (MISCELLANEOUS) ×2 IMPLANT
SET CYSTO W/LG BORE CLAMP LF (SET/KITS/TRAYS/PACK) ×2 IMPLANT
SHEATH URETERAL 12FRX35CM (MISCELLANEOUS) IMPLANT
STENT URET 6FRX24 CONTOUR (STENTS) ×2 IMPLANT
STENT URET 6FRX26 CONTOUR (STENTS) IMPLANT
SURGILUBE 2OZ TUBE FLIPTOP (MISCELLANEOUS) ×2 IMPLANT
SYR 10ML LL (SYRINGE) ×2 IMPLANT
TRACTIP FLEXIVA PULSE ID 200 (Laser) ×2 IMPLANT
VALVE UROSEAL ADJ ENDO (VALVE) IMPLANT
WATER STERILE IRR 1000ML POUR (IV SOLUTION) ×2 IMPLANT

## 2020-05-02 NOTE — Transfer of Care (Signed)
Immediate Anesthesia Transfer of Care Note  Patient: Teresa Salazar  Procedure(s) Performed: CYSTOSCOPY/URETEROSCOPY/HOLMIUM LASER/STENT PLACEMENT (Left )  Patient Location: Endoscopy Unit  Anesthesia Type:General  Level of Consciousness: awake, oriented and patient cooperative  Airway & Oxygen Therapy: Patient Spontanous Breathing and Patient connected to face mask oxygen  Post-op Assessment: Report given to RN and Post -op Vital signs reviewed and stable  Post vital signs: Reviewed and stable  Last Vitals:  Vitals Value Taken Time  BP    Temp    Pulse 95 05/02/20 1535  Resp 32 05/02/20 1535  SpO2 100 % 05/02/20 1535  Vitals shown include unvalidated device data.  Last Pain:  Vitals:   05/02/20 1108  TempSrc: Oral  PainSc: 0-No pain         Complications: No complications documented.

## 2020-05-02 NOTE — Discharge Instructions (Signed)

## 2020-05-02 NOTE — Anesthesia Preprocedure Evaluation (Signed)
Anesthesia Evaluation  Patient identified by MRN, date of birth, ID band Patient awake    Reviewed: Allergy & Precautions, NPO status , Patient's Chart, lab work & pertinent test results  History of Anesthesia Complications Negative for: history of anesthetic complications  Airway Mallampati: II  TM Distance: >3 FB Neck ROM: Full    Dental  (+) Missing   Pulmonary neg sleep apnea, neg COPD, Current SmokerPatient did not abstain from smoking.,    breath sounds clear to auscultation- rhonchi (-) wheezing      Cardiovascular Exercise Tolerance: Good hypertension, Pt. 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 No date: Diverticulitis No date: Elevated blood pressure, situational     Comment:  h/o -has not been on bp meds in years due to bp control No date: GERD (gastroesophageal reflux disease) No date: History of kidney stones   Reproductive/Obstetrics                             Anesthesia Physical Anesthesia Plan  ASA: II  Anesthesia Plan: General   Post-op Pain Management:    Induction: Intravenous  PONV Risk Score and Plan: 1 and Ondansetron and Dexamethasone  Airway Management Planned: Oral ETT  Additional Equipment:   Intra-op Plan:   Post-operative Plan: Extubation in OR  Informed Consent: I have reviewed the patients History and Physical, chart, labs and discussed the procedure including  the risks, benefits and alternatives for the proposed anesthesia with the patient or authorized representative who has indicated his/her understanding and acceptance.     Dental advisory given  Plan Discussed with: CRNA and Anesthesiologist  Anesthesia Plan Comments:         Anesthesia Quick Evaluation

## 2020-05-02 NOTE — Op Note (Signed)
Date of procedure: 05/02/20  Preoperative diagnosis:  1. Left ureteral stone  Postoperative diagnosis:  1. Same  Procedure: 1. Cystoscopy, left ureteroscopy, laser lithotripsy, left retrograde pyelogram with intraoperative interpretation, left ureteral stent placement 2. Right retrograde pyelogram with intraoperative interpretation  Surgeon: Nickolas Madrid, MD  Anesthesia: General  Complications: None  Intraoperative findings:  1.  Normal cystoscopy, ureteral orifices orthotopic bilaterally, no suspicious lesions or erythema 2.  Impacted small left distal ureteral stone and larger mid right ureteral stone, extremely tight left distal ureter.  All fragments basket extracted 3.  Right retrograde pyelogram with no filling defects or hydronephrosis, right kidney drained appropriately  EBL: Minimal  Specimens: Stone for analysis  Drains: Left 6 French by 24 cm ureteral stent  Indication: Teresa Salazar is a complex 60 y.o. patient who originally presented with gross hematuria and abdominal pain and underwent a CT urogram in early April.  This showed a rectosigmoid abscess and she was started on antibiotics by general surgery, and a 4 mm left distal ureteral stone with upstream hydronephrosis and a larger 1.2 cm left renal stone.  A follow-up KUB performed on 04/30/2020 showed migration of the larger stone into the left mid ureter.  A CT ordered by general surgery for follow-up of her abscess yesterday confirmed the large stone had migrated into the left mid ureter, and showed a possible small right proximal ureteral stone.  After reviewing the management options for treatment, they elected to proceed with the above surgical procedure(s). We have discussed the potential benefits and risks of the procedure, side effects of the proposed treatment, the likelihood of the patient achieving the goals of the procedure, and any potential problems that might occur during the procedure or  recuperation. Informed consent has been obtained.  Description of procedure:  The patient was taken to the operating room and general anesthesia was induced. SCDs were placed for DVT prophylaxis. The patient was placed in the dorsal lithotomy position, prepped and draped in the usual sterile fashion, and preoperative antibiotics(Ancef) were administered. A preoperative time-out was performed.   A 21 French rigid cystoscope was used to intubate the urethra and thorough cystoscopy was performed.  The bladder was grossly normal, and the ureteral orifices were orthotopic bilaterally.  On scout film, there was significant retained oral contrast from her prior recent CT, primarily in the descending colon.  I started by advancing a sensor wire into the left ureteral orifice and this passed easily up to the kidney under fluoroscopic vision.  I attempted to advance a semirigid ureteroscope alongside the wire, but met resistance in the distal ureter.  A second sensor wire was used to train track the ureter, and I still was unable to navigate up to the stone.  A dual-lumen ureteral access catheter was advanced over the wire under fluoroscopic vision, but again met resistance in the distal ureter.  After I repassed the scope, with the aid of a sensor wire to train track I was able to identify a stone in the distal ureter.  The ureter was extremely tight.  A 242 m laser fiber on settings of 0.5 J and 40hz  was used to fragment the stone to dust, and the pieces were irrigated free from the ureter.  I then advanced the scope further up and identified the larger 1.2 cm stone, and this was again fragmented to dust on previously mentioned settings.  There were multiple residual pieces, and all of these were basket extracted.  Thorough ureteroscopy showed no  residual fragments within the ureter, and the ureter was extremely tight and edematous.  A retrograde pyelogram was performed from the mid ureter that showed no  extravasation or filling defects.  I attempted to pass the dual-lumen ureteral access catheter to add a safety wire and consider flexible ureteroscopy to clear the proximal ureter and treat any renal stones, however again met resistance in the distal ureter.  At this point I opted to place a left ureteral stent.  The rigid cystoscope was backloaded over the wire and a 6 [place] by 24 cm ureteral stent was uneventfully placed with an excellent curl in the upper pole as well as under direct vision in the bladder.  I then turned my attention to the right distal ureter and a 5 French access catheter was used to intubate the right ureteral orifice.  A retrograde pyelogram was performed which showed no hydronephrosis or filling defects.  A spot film 3 minutes later showed drainage of almost all contrast from the right kidney and no evidence of obstruction.  The bladder was drained and a belladonna suppository was placed.  Disposition: Stable to PACU  Plan: Follow-up for left ureteral stent removal in 2 weeks Keep follow-up with general surgery for rectosigmoid abscess  Nickolas Madrid, MD

## 2020-05-02 NOTE — Anesthesia Procedure Notes (Signed)
Procedure Name: Intubation Date/Time: 05/02/2020 2:20 PM Performed by: Hermenia Bers, CRNA Pre-anesthesia Checklist: Patient identified, Patient being monitored, Timeout performed, Emergency Drugs available and Suction available Patient Re-evaluated:Patient Re-evaluated prior to induction Oxygen Delivery Method: Circle system utilized Preoxygenation: Pre-oxygenation with 100% oxygen Induction Type: IV induction Ventilation: Mask ventilation without difficulty Laryngoscope Size: 3 and McGraph Grade View: Grade I Tube type: Oral Tube size: 7.0 mm Number of attempts: 1 Airway Equipment and Method: Stylet Placement Confirmation: ETT inserted through vocal cords under direct vision,  positive ETCO2 and breath sounds checked- equal and bilateral Secured at: 21 cm Tube secured with: Tape Dental Injury: Teeth and Oropharynx as per pre-operative assessment

## 2020-05-02 NOTE — Interval H&P Note (Signed)
UROLOGY H&P UPDATE  Agree with prior H&P dated 04/15/2020.  60 year old female with gross hematuria, and most recent CT showing a large 1 cm left mid ureteral stone with upstream hydronephrosis.  She also has a rectosigmoid abscess that has been on antibiotics, and has a colonoscopy to rule out malignancy and possible bowel resection in the future with general surgery.  Cardiac: RRR Lungs: CTA bilaterally  Laterality: Left Procedure: Left ureteroscopy, laser lithotripsy, stent placement  Urine: Culture 4/21 no growth  We specifically discussed the risks ureteroscopy including bleeding, infection/sepsis, stent related symptoms including flank pain/urgency/frequency/incontinence/dysuria, ureteral injury, inability to access stone, or need for staged or additional procedures.   Sondra Come, MD 05/02/2020

## 2020-05-02 NOTE — Anesthesia Postprocedure Evaluation (Signed)
Anesthesia Post Note  Patient: Teresa Salazar  Procedure(s) Performed: CYSTOSCOPY/URETEROSCOPY/HOLMIUM LASER/STENT PLACEMENT (Left )  Patient location during evaluation: PACU Anesthesia Type: General Level of consciousness: awake and alert Pain management: pain level controlled Vital Signs Assessment: post-procedure vital signs reviewed and stable Respiratory status: spontaneous breathing, nonlabored ventilation, respiratory function stable and patient connected to nasal cannula oxygen Cardiovascular status: blood pressure returned to baseline and stable Postop Assessment: no apparent nausea or vomiting Anesthetic complications: no   No complications documented.   Last Vitals:  Vitals:   05/02/20 1615 05/02/20 1622  BP: (!) 155/83 (!) 163/77  Pulse: 73 67  Resp: 14   Temp: 36.6 C 36.6 C  SpO2: 95% 98%    Last Pain:  Vitals:   05/02/20 1622  TempSrc: Temporal  PainSc: 0-No pain                 Cleda Mccreedy Finnian Husted

## 2020-05-02 NOTE — Progress Notes (Signed)
Dentures (top and bottom) sent to PACU, the rest of belongings were stored in the post- op locker.

## 2020-05-03 ENCOUNTER — Encounter: Payer: Self-pay | Admitting: Urology

## 2020-05-07 LAB — CULTURE, URINE COMPREHENSIVE

## 2020-05-08 ENCOUNTER — Telehealth: Payer: Self-pay

## 2020-05-08 LAB — CALCULI, WITH PHOTOGRAPH (CLINICAL LAB)
Calcium Oxalate Dihydrate: 10 %
Calcium Oxalate Monohydrate: 90 %
Weight Calculi: 6 mg

## 2020-05-08 NOTE — Telephone Encounter (Signed)
Patient is calling asking about the results of her CT scan she had done on 05/01/20.

## 2020-05-09 NOTE — Telephone Encounter (Signed)
Spoke with the patient and let her know per Dr Everlene Farrier that she does have a fistula that would need to be surgically taken care of in the future. Right know he is more concerned with what Dr Richardo Hanks is treating her for. She has made a follow up appointment to speak further with Dr Everlene Farrier for the beginning of June.

## 2020-05-15 ENCOUNTER — Encounter: Payer: Self-pay | Admitting: Urology

## 2020-05-15 ENCOUNTER — Ambulatory Visit (INDEPENDENT_AMBULATORY_CARE_PROVIDER_SITE_OTHER): Payer: Self-pay | Admitting: Urology

## 2020-05-15 ENCOUNTER — Other Ambulatory Visit: Payer: Self-pay

## 2020-05-15 VITALS — BP 185/90 | HR 90 | Ht 61.0 in | Wt 115.0 lb

## 2020-05-15 DIAGNOSIS — Z466 Encounter for fitting and adjustment of urinary device: Secondary | ICD-10-CM

## 2020-05-15 DIAGNOSIS — N200 Calculus of kidney: Secondary | ICD-10-CM

## 2020-05-15 LAB — URINALYSIS, COMPLETE
Bilirubin, UA: NEGATIVE
Glucose, UA: NEGATIVE
Ketones, UA: NEGATIVE
Nitrite, UA: NEGATIVE
Specific Gravity, UA: 1.02 (ref 1.005–1.030)
Urobilinogen, Ur: 0.2 mg/dL (ref 0.2–1.0)
pH, UA: 6 (ref 5.0–7.5)

## 2020-05-15 LAB — MICROSCOPIC EXAMINATION
Bacteria, UA: NONE SEEN
RBC, Urine: >30 /hpf — AB (ref 0–2)

## 2020-05-15 MED ORDER — LIDOCAINE HCL URETHRAL/MUCOSAL 2 % EX GEL
1.0000 "application " | Freq: Once | CUTANEOUS | Status: AC
  Administered 2020-05-15: 1 via URETHRAL

## 2020-05-15 MED ORDER — SULFAMETHOXAZOLE-TRIMETHOPRIM 800-160 MG PO TABS
2.0000 | ORAL_TABLET | Freq: Once | ORAL | Status: AC
  Administered 2020-05-15: 2 via ORAL

## 2020-05-15 NOTE — Patient Instructions (Signed)

## 2020-05-15 NOTE — Progress Notes (Signed)
Cystoscopy Procedure Note:  Indication: Stent removal s/p 05/02/2020 left ureteroscopy, laser lithotripsy for small distal ureteral stone and large mid ureteral stone, left ureter extremely tight and narrow  She also has a rectosigmoid abscess and fistula that is being followed by Dr. Everlene Farrier in general surgery and likely will require colectomy in the future  Bactrim given for prophylaxis  After informed consent and discussion of the procedure and its risks, Teresa Salazar was positioned and prepped in the standard fashion. Cystoscopy was performed with a flexible cystoscope. The stent was grasped with flexible graspers and removed in its entirety. The patient tolerated the procedure well.  Findings: Uncomplicated stent removal  Assessment and Plan: Keep follow-up with general surgery for rectosigmoid abscess and fistula Renal ultrasound in 4 to 6 weeks to rule out silent hydronephrosis  Sondra Come, MD 05/15/2020

## 2020-05-16 ENCOUNTER — Ambulatory Visit: Payer: Self-pay | Admitting: Family Medicine

## 2020-05-16 ENCOUNTER — Encounter: Payer: Self-pay | Admitting: Family Medicine

## 2020-05-16 VITALS — BP 150/80 | HR 89 | Temp 98.6°F | Ht 61.0 in | Wt 110.0 lb

## 2020-05-16 DIAGNOSIS — K219 Gastro-esophageal reflux disease without esophagitis: Secondary | ICD-10-CM

## 2020-05-16 DIAGNOSIS — I10 Essential (primary) hypertension: Secondary | ICD-10-CM

## 2020-05-16 DIAGNOSIS — N321 Vesicointestinal fistula: Secondary | ICD-10-CM

## 2020-05-16 DIAGNOSIS — Z87442 Personal history of urinary calculi: Secondary | ICD-10-CM

## 2020-05-16 MED ORDER — AMLODIPINE BESYLATE 10 MG PO TABS: 10.0000 mg | ORAL_TABLET | Freq: Every day | ORAL | 1 refills | Status: DC

## 2020-05-16 NOTE — Patient Instructions (Addendum)
Thank you for coming to the office today.  Keep [place] posted on updates from specialists  Start new BP medication, Amlodipine 10mg  daily, one in morning with or without food, can cause some mild swelling of lower extremities this can be normal, raise them up in evening to reduce swelling.  Keep track of BP goal < 140 / 90. Call if need something sooner.   Please schedule a Follow-up Appointment to: Return in about 4 months (around 09/16/2020) for 4 month follow-up HTN, updates from gen surgery / urology.  If you have any other questions or concerns, please feel free to call the office or send a message through MyChart. You may also schedule an earlier appointment if necessary.  Additionally, you may be receiving a survey about your experience at our office within a few days to 1 week by e-mail or mail. We value your feedback.  09/18/2020, DO [facility], [facility]

## 2020-05-16 NOTE — Progress Notes (Signed)
Subjective:    Patient ID: Teresa Salazar, female    DOB: [birth year], 60 y.o.   MRN: [id number]  FRANK PILGER is a 60 y.o. female presenting on 05/16/2020 for Gastroesophageal Reflux (Pt had kidney stones removal- 05/02/2020, not taking any meds /) and uro/surg updates   HPI   RESOLVED - Gross Hematuria / History of Kidney Stones Urgent Care on 04/10/20 - [place] 2-3 months of hematuria reported. She is a smoker. She had UA done at Urgent Care, they referred her to [facility]. She had CT imaging done on 4/12  Showed nephrolithiasis, and kidney cyst also rectosigmoid fistula. - Has seen Urology, had repeat CT 4/28 - confirmed high grade nephrolithiasis, then scheduled 4/29 for cystoscopy ureteroscopy stone removal and stent placement. - Has done well, since that time and removed stent on 05/15/20, did not require a repeat stent. - Repeat US within 6 weeks for resolution  Colo-vesicular Fistula Diverticulitis Followed by Dr [facility] Recent CT imaging back to 4/12 and updated on 4/28 with fistula still see below Previous treat Cipro Flagyl in past Now she is going to need bowel resection to remove fistula, next apt 06/09/20 with Dr Everlene Farrier  CHRONIC HTN: Reports elevated BP for few years episodic raising BP. Current Meds - never on med   Denies CP, dyspnea, HA, edema, dizziness / lightheadedness  Additionally regarding previous GERD - she was having secondary symptoms to the kidney stone and bowels impacting her appetite and wt loss, now the acid reflux has resolved Off Omeprazole famotidine now   Depression screen [facility] 2/9 05/16/2020 04/17/2020  Decreased Interest 0 1  Down, Depressed, Hopeless 0 0  PHQ - 2 Score 0 1  Altered sleeping 0 -  Tired, decreased energy 0 -  Change in appetite 0 -  Feeling bad or failure about yourself  0 -  Trouble concentrating 0 -  Moving slowly or fidgety/restless 0 -  Suicidal thoughts 0 -  PHQ-9 Score 0 -     Social History   Tobacco Use  . Smoking status: Current Every Day Smoker    Packs/day: 0.50    Years: 30.00    Pack years: 15.00    Types: Cigarettes  . Smokeless tobacco: Never Used  Vaping Use  . Vaping Use: Never used  Substance Use Topics  . Alcohol use: Never  . Drug use: Never    Review of Systems Per HPI unless specifically indicated above     Objective:    BP (!) 150/80 (BP Location: Left Arm, Cuff Size: Normal)   Pulse 89   Temp 98.6 F (37 C) (Oral)   Ht 5\' 1"  (1.549 m)   Wt 110 lb (49.9 kg)   SpO2 100%   BMI 20.78 kg/m   Wt Readings from Last 3 Encounters:  05/16/20 110 lb (49.9 kg)  05/15/20 115 lb (52.2 kg)  04/30/20 115 lb (52.2 kg)    Physical Exam Vitals and nursing note reviewed.  Constitutional:      General: She is not in acute distress.    Appearance: She is well-developed. She is not diaphoretic.     Comments: Well-appearing, comfortable, cooperative  HENT:     Head: Normocephalic and atraumatic.  Eyes:     General:        Right eye: No discharge.        Left eye: No discharge.     Conjunctiva/sclera: Conjunctivae normal.  Cardiovascular:     Rate and  Rhythm: Normal rate.  Pulmonary:     Effort: Pulmonary effort is normal.  Skin:    General: Skin is warm and dry.     Findings: No erythema or rash.  Neurological:     Mental Status: She is alert and oriented to person, place, and time.  Psychiatric:        Behavior: Behavior normal.     Comments: Well groomed, good eye contact, normal speech and thoughts      I have personally reviewed the radiology report from 05/01/20 on CT Abd Pelvis.  CT Abdomen Pelvis W Contrast [[id number]] Resulted: 05/03/20 1849  Order Status: Completed Updated: 05/03/20 1851  Narrative:   CLINICAL DATA: Urinary tract calculi, lower abdominal pain, known  colonic fistula   EXAM:  CT ABDOMEN AND PELVIS WITH CONTRAST   TECHNIQUE:  Multidetector CT imaging of the abdomen and pelvis was performed   using the standard protocol following bolus administration of  intravenous contrast.   CONTRAST: 52mL OMNIPAQUE IOHEXOL 300 MG/ML SOLN   COMPARISON: 04/15/2020   FINDINGS:  Lower chest: No acute pleural or parenchymal lung disease.   Hepatobiliary: No focal liver abnormality is seen. No gallstones,  gallbladder wall thickening, or biliary dilatation.   Pancreas: Unremarkable. No pancreatic ductal dilatation or  surrounding inflammatory changes.   Spleen: Normal in size without focal abnormality.   Adrenals/Urinary Tract: There is high-grade left-sided obstructive  uropathy with delayed left nephrogram and no significant excretion  of contrast on delayed imaging. The left renal calculus seen on  prior CT has migrated into the mid left ureter, measuring 11 mm  reference image 43/2. Stable position of the 4 mm distal left  ureteral calculus reference image 54/2.   There is a stable 2 mm nonobstructing right renal calculus.   Numerous bilateral renal cortical cysts are noted. Bladder is  decompressed, limiting its evaluation.   Stable bilateral adrenal adenomas.   Stomach/Bowel: No bowel obstruction or ileus. Chronic wall  thickening of the sigmoid colon likely reflects previous bouts of  diverticulitis and scarring. The colorectal fistula seen on previous  study is again identified, with 1.5 x 2.7 cm fluid collection in the  rectosigmoid mesentery unchanged.   Vascular/Lymphatic: Stable atherosclerosis. No pathologic adenopathy  within the abdomen or pelvis.   Reproductive: Status post hysterectomy. No adnexal masses.   Other: No free fluid or free intraperitoneal gas. Small fat  containing umbilical hernia. No bowel herniation.   Musculoskeletal: No acute or destructive bony lesions. Reconstructed  images demonstrate no additional findings.   IMPRESSION:  1. High-grade left-sided obstructive uropathy has developed in the  interim, with distal migration of the 11  mm left renal calculus into  the mid left ureter as above. Stable position of the 4 mm distal  left ureteral calculus.  2. Multiple other stable small nonobstructing renal calculi.  3. Stable scarring of the rectosigmoid colon, with evidence of  colorectal fistula and mesenteric fluid collection unchanged since  previous exam.  4. Aortic Atherosclerosis (ICD10-I70.0).    Electronically Signed  By: Sharlet Salina M.D.  On: 05/03/2020 18:49      Results for orders placed or performed in visit on 05/15/20  Microscopic Examination   Urine  Result Value Ref Range   WBC, UA 11-30 (A) 0 - 5 /hpf   RBC >30 (A) 0 - 2 /hpf   Epithelial Cells (non renal) 0-10 0 - 10 /hpf   Bacteria, UA None seen None seen/Few  Urinalysis, Complete  Result  Value Ref Range   Specific Gravity, UA 1.020 1.005 - 1.030   pH, UA 6.0 5.0 - 7.5   Color, UA Yellow Yellow   Appearance Ur Cloudy (A) Clear   Leukocytes,UA 2+ (A) Negative   Protein,UA 1+ (A) Negative/Trace   Glucose, UA Negative Negative   Ketones, UA Negative Negative   RBC, UA 3+ (A) Negative   Bilirubin, UA Negative Negative   Urobilinogen, Ur 0.2 0.2 - 1.0 mg/dL   Nitrite, UA Negative Negative   Microscopic Examination See below:       Assessment & Plan:   Problem List Items Addressed This Visit    History of nephrolithiasis   GERD (gastroesophageal reflux disease)   Essential hypertension - Primary   Relevant Medications   amLODipine (NORVASC) 10 MG tablet   Colovesical fistula      GERD Improved, s/p treatment of kidney stones - seems may have been related to pain and nausea symptoms Off PPI  Nephrolithiasis Removed s/p ureteroscopy, and had stent now removed Followed by [facility]  Colovesical Fistula Pending repeat CT w/ Gen Surgery and follow up for planning surgical fix Had prior abscess since improved/resolved   Meds ordered this encounter  Medications  . amLODipine (NORVASC) 10 MG tablet    Sig: Take 1  tablet (10 mg total) by mouth daily.    Dispense:  90 tablet    Refill:  1      Follow up plan: Return in about 4 months (around 09/16/2020) for 4 month follow-up HTN, updates from gen surgery / urology.   Saralyn Pilar, DO [facility] 05/16/2020, 2:41 PM

## 2020-05-18 ENCOUNTER — Other Ambulatory Visit: Payer: Self-pay | Admitting: Urology

## 2020-05-18 DIAGNOSIS — N200 Calculus of kidney: Secondary | ICD-10-CM

## 2020-05-22 ENCOUNTER — Ambulatory Visit: Payer: Self-pay | Admitting: Urology

## 2020-06-09 ENCOUNTER — Encounter: Payer: Self-pay | Admitting: Surgery

## 2020-06-09 ENCOUNTER — Ambulatory Visit (INDEPENDENT_AMBULATORY_CARE_PROVIDER_SITE_OTHER): Payer: Self-pay | Admitting: Surgery

## 2020-06-09 ENCOUNTER — Other Ambulatory Visit: Payer: Self-pay

## 2020-06-09 ENCOUNTER — Telehealth: Payer: Self-pay | Admitting: Surgery

## 2020-06-09 VITALS — BP 170/100 | HR 90 | Temp 98.5°F | Ht 61.0 in | Wt 111.4 lb

## 2020-06-09 DIAGNOSIS — L988 Other specified disorders of the skin and subcutaneous tissue: Secondary | ICD-10-CM

## 2020-06-09 DIAGNOSIS — N321 Vesicointestinal fistula: Secondary | ICD-10-CM

## 2020-06-09 MED ORDER — NEOMYCIN SULFATE 500 MG PO TABS: ORAL_TABLET | ORAL | 0 refills | Status: DC

## 2020-06-09 MED ORDER — BISACODYL 5 MG PO TBEC: DELAYED_RELEASE_TABLET | ORAL | 0 refills | Status: DC

## 2020-06-09 MED ORDER — POLYETHYLENE GLYCOL 3350 17 GM/SCOOP PO POWD: ORAL | 0 refills | Status: DC

## 2020-06-09 MED ORDER — METRONIDAZOLE 500 MG PO TABS: ORAL_TABLET | ORAL | 0 refills | Status: DC

## 2020-06-09 NOTE — Telephone Encounter (Signed)
Incoming call from patient, she is informed of all dates regarding her surgery and verbalized understanding.

## 2020-06-09 NOTE — Patient Instructions (Addendum)
We have discussed removing a portion of your damaged colon through 4 small incisions today. We will schedul this surgery at [facility] with Dr. Everlene Farrier. Please plan a hospital stay of 3-7 days for surgery and recovery time.  We will have you complete a bowel prep prior to your surgery. We have sent in prescriptions to your pharmacy for this. Please see information provided.  You have also been given a (Blue) Pre-Care Sheet with more information regarding your particular surgery. Our surgery scheduler will call you to look at surgery dates and to go over information and instructions.  Please review all information given.  Please call our office with any questions or concerns prior to your scheduled surgery.   Laparoscopic Colectomy Laparoscopic colectomy is surgery to remove part or all of the large intestine (colon). This procedure may be used to treat several conditions, including:  Inflammation and infection of the colon (diverticulitis).  Tumors or masses in the colon.  Inflammatory bowel disease, such as Crohn disease or ulcerative colitis. Colectomy is an option when symptoms cannot be controlled with medicines.  Bleeding from the colon that cannot be controlled by another method.  Blockage or obstruction of the colon.  Tell a health care provider about:  Any allergies you have.  All medicines you are taking, including vitamins, herbs, eye drops, creams, and over-the-counter medicines.  Any problems you or family members have had with anesthetic medicines.  Any blood disorders you have.  Any surgeries you have had.  Any medical conditions you have. What are the risks? Generally, this is a safe procedure. However, problems may occur, including:  Infection.  Bleeding.  Allergic reactions to medicines or dyes.  Damage to other structures or organs.  Leaking from where the colon was sewn together.  Future blockage of the small intestines from scar tissue. Another surgery may  be needed to repair this.  Needing to convert to an open procedure. Complications such as damage to other organs or excessive bleeding may require the surgeon to convert from a laparoscopic procedure to an open procedure. This involves making a larger incision in the abdomen.  What happens before the procedure? Staying hydrated Follow instructions from your health care provider about hydration, which may include:  Up to 2 hours before the procedure - you may continue to drink clear liquids, such as water, clear fruit juice, black coffee, and plain tea.  Eating and drinking restrictions Follow instructions from your health care provider about eating and drinking, which may include:  8 hours before the procedure - stop eating heavy meals, meals with high fiber, or foods such as meat, fried foods, or fatty foods.  6 hours before the procedure - stop eating light meals or foods, such as toast or cereal.  6 hours before the procedure - stop drinking milk or drinks that contain milk.  2 hours before the procedure - stop drinking clear liquids.  Medicines  Ask your health care provider about: ? Changing or stopping your regular medicines. This is especially important if you are taking diabetes medicines or blood thinners. ? Taking medicines such as aspirin and ibuprofen. These medicines can thin your blood. Do not take these medicines before your procedure if your health care provider instructs you not to.  You may be given antibiotic medicine to clean out bacteria from your colon. Follow the directions carefully and take the medicine at the correct time. General instructions  You may be prescribed an oral bowel prep to clean out your  colon in preparation for the surgery: ? Follow instructions from your health care provider about how to do this. ? Do not eat or drink anything else after you have started the bowel prep, unless your health care provider tells you it is safe to do so.  Do not  use any products that contain nicotine or tobacco, such as cigarettes and e-cigarettes. If you need help quitting, ask your health care provider. What happens during the procedure?  To reduce your risk of infection: ? Your health care team will wash or sanitize their hands. ? Your skin will be washed with soap.  An IV tube will be inserted into one of your veins to deliver fluid and medication.  You will be given one of the following: ? A medicine to help you relax (sedative). ? A medicine to make you fall asleep (general anesthetic).  Small monitors will be connected to your body. They will be used to check your heart, blood pressure, and oxygen level.  A breathing tube may be placed into your lungs during the procedure.  A thin, flexible tube (catheter) will be placed into your bladder to drain urine.  A tube may be placed through your nose and into your stomach to drain stomach fluids (nasogastric tube, or NG tube).  Your abdomen will be filled with air so it expands. This gives the surgeon more room to operate and makes your organs easier to see.  Several small cuts (incisions) will be made in your abdomen.  A thin, lighted tube with a tiny camera on the end (laparoscope) will be put through one of the small incisions. The camera on the laparoscope will send a picture to a computer screen in the operating room. This will give the surgeon a good view inside your abdomen.  Hollow tubes will be put through the other small incisions in your abdomen. The tools that are needed for the procedure will be put through these tubes.  Clamps or staples will be put on both ends of the diseased part of the colon.  The part of the intestine between the clamps or staples will be removed.  If possible, the ends of the healthy colon that remain will be stitched (sutured) or stapled together to allow your body to pass waste (stool).  Sometimes, the remaining colon cannot be stitched back together.  If this is the case, a colostomy will be needed. If you need a colostomy: ? An opening to the outside of your body (stoma) will be made through your abdomen. ? The end of your colon will be brought to the opening. It will be stitched to the skin. ? A bag will be attached to the opening. Stool will drain into this removable bag. ? The colostomy may be temporary or permanent.  The incisions from the colectomy will be closed with sutures or staples. The procedure may vary among health care providers and hospitals. What happens after the procedure?  Your blood pressure, heart rate, breathing rate, and blood oxygen level will be monitored until the medicines you were given have worn off.  You will receive fluids through an IV tube until your bowels start to work properly.  Once your bowels are working again, you will be given clear liquids first and then solid food as tolerated.  You will be given medicines to control your pain and nausea, if needed.  Do not drive for 24 hours if you were given a sedative. This information is not intended to replace  advice given to you by your health care provider. Make sure you discuss any questions you have with your health care provider. Document Released: 03/13/2002 Document Revised: 09/22/2015 Document Reviewed: 09/22/2015 Elsevier Interactive Patient Education  2018 [employer]     Laparoscopic Colectomy, Care After This sheet gives you information about how to care for yourself after your procedure. Your health care provider may also give you more specific instructions. If you have problems or questions, contact your health care provider. What can I expect after the procedure? After your procedure, it is common to have the following:  Pain in your abdomen, especially in the incision areas. You will be given medicine to control the pain.  Tiredness. This is a normal part of the recovery process. Your energy level will return to normal over the  next several weeks.  Changes in your bowel movements, such as constipation or needing to go more often. Talk with your health care provider about how to manage this.  Follow these instructions at home: Medicines  Take over-the-counter and prescription medicines only as told by your health care provider.  Do not drive or use heavy machinery while taking prescription pain medicine.  Do not drink alcohol while taking prescription pain medicine.  If you were prescribed an antibiotic medicine, use it as told by your health care provider. Do not stop using the antibiotic even if you start to feel better. Incision care  Follow instructions from your health care provider about how to take care of your incision areas. Make sure you: ? Keep your incisions clean and dry. ? Wash your hands with soap and water before and after applying medicine to the areas, and before and after changing your bandage (dressing). If soap and water are not available, use hand sanitizer. ? Change your dressing as told by your health care provider. ? Leave stitches (sutures), skin glue, or adhesive strips in place. These skin closures may need to stay in place for 2 weeks or longer. If adhesive strip edges start to loosen and curl up, you may trim the loose edges. Do not remove adhesive strips completely unless your health care provider tells you to do that.  Do not wear tight clothing over the incisions. Tight clothing may rub and irritate the incision areas, which may cause the incisions to open.  Do not take baths, swim, or use a hot tub until your health care provider approves. Ask your health care provider if you can take showers. You may only be allowed to take sponge baths for bathing.  Check your incision area every day for signs of infection. Check for: ? More redness, swelling, or pain. ? More fluid or blood. ? Warmth. ? Pus or a bad smell. Activity  Avoid lifting anything that is heavier than 10 lb (4.5  kg) for 2 weeks or until your health care provider says it is okay.  You may resume normal activities as told by your health care provider. Ask your health care provider what activities are safe for you.  Take rest breaks during the day as needed. Eating and drinking  Follow instructions from your health care provider about what you can eat after surgery.  To prevent or treat constipation while you are taking prescription pain medicine, your health care provider may recommend that you: ? Drink enough fluid to keep your urine clear or pale yellow. ? Take over-the-counter or prescription medicines. ? Eat foods that are high in fiber, such as fresh fruits and vegetables,  whole grains, and beans. ? Limit foods that are high in fat and processed sugars, such as fried and sweet foods. General instructions  Ask your health care provider when you will need an appointment to get your sutures or staples removed.  Keep all follow-up visits as told by your health care provider. This is important. Contact a health care provider if:  You have more redness, swelling, or pain around your incisions.  You have more fluid or blood coming from the incisions.  Your incisions feel warm to the touch.  You have pus or a bad smell coming from your incisions or your dressing.  You have a fever.  You have an incision that breaks open (edges not staying together) after sutures or staples have been removed. Get help right away if:  You develop a rash.  You have chest pain or difficulty breathing.  You have pain or swelling in your legs.  You feel light-headed or you faint.  Your abdomen swells (becomes distended).  You have nausea or vomiting.  You have blood in your stool (feces). This information is not intended to replace advice given to you by your health care provider. Make sure you discuss any questions you have with your health care provider. Document Released: 07/10/2004 Document Revised:  09/22/2015 Document Reviewed: 09/22/2015 Elsevier Interactive Patient Education  [employer].

## 2020-06-09 NOTE — H&P (View-Only) (Signed)
Outpatient Surgical Follow Up  06/09/2020  Teresa Salazar is an 60 y.o. female.   Chief Complaint  Patient presents with  . Follow-up    HPI: Is a 60 year old female well-known to me with a history of colovesical fistula and abscess.  Treated with antibiotic therapy.  She is doing better.  She also had a kidney stone and had a recent cystoscopy and stent placement.  She feels much better.  Now her blood pressure is better controlled.  No fevers no chills. She  did have CT scan showing evidence of a colovesical fistula, I did personally review the images.  He also had adrenal adenomas.  Past Medical History:  Diagnosis Date  . Colovesical fistula 2022  . Diverticulitis   . Elevated blood pressure, situational    h/o -has not been on bp meds in years due to bp control  . GERD (gastroesophageal reflux disease)   . History of kidney stones   . Kidney stone     Past Surgical History:  Procedure Laterality Date  . CESAREAN SECTION  1985  . CYSTOSCOPY/URETEROSCOPY/HOLMIUM LASER/STENT PLACEMENT Left 05/02/2020   Procedure: CYSTOSCOPY/URETEROSCOPY/HOLMIUM LASER/STENT PLACEMENT;  Surgeon: Sondra Come, MD;  Location: [facility] ORS;  Service: Urology;  Laterality: Left;    Family History  Problem Relation Age of Onset  . Diabetes Mother   . Diabetes Father     Social History:  reports that she has been smoking cigarettes. She has a 7.50 pack-year smoking history. She has never used smokeless tobacco. She reports that she does not drink alcohol and does not use drugs.  Allergies: No Known Allergies  Medications reviewed.    ROS Full ROS performed and is otherwise negative other than what is stated in HPI   BP (!) 170/100   Pulse 90   Temp 98.5 F (36.9 C)   Ht 5\' 1"  (1.549 m)   Wt 111 lb 6.4 oz (50.5 kg)   SpO2 97%   BMI 21.05 kg/m   Physical Exam Vitals and nursing note reviewed. Exam conducted with a chaperone present.  Constitutional:      Appearance: Normal  appearance. She is normal weight. She is not ill-appearing.  Eyes:     General:        Right eye: No discharge.        Left eye: No discharge.  Cardiovascular:     Rate and Rhythm: Normal rate and regular rhythm.     Heart sounds: No murmur heard.   Pulmonary:     Effort: Pulmonary effort is normal. No respiratory distress.     Breath sounds: No stridor. No wheezing or rhonchi.  Abdominal:     General: Abdomen is flat. There is no distension.     Palpations: Abdomen is soft. There is no mass.     Tenderness: There is no abdominal tenderness. There is no guarding or rebound.     Hernia: No hernia is present.  Musculoskeletal:     Cervical back: Normal range of motion and neck supple. No rigidity or tenderness.  Lymphadenopathy:     Cervical: No cervical adenopathy.  Skin:    General: Skin is warm and dry.     Capillary Refill: Capillary refill takes less than 2 seconds.  Neurological:     General: No focal deficit present.     Mental Status: She is alert and oriented to person, place, and time.  Psychiatric:        Mood and Affect: Mood normal.  Behavior: Behavior normal.        Thought Content: Thought content normal.        Judgment: Judgment normal.        Assessment/Plan: 60 year old female with colovesical fistula.  Currently seems to be optimized.  Not septic.  Still having some recurrent UTIs. Cussed with her that the ultimate fix will be to perform sigmoid colectomy and potential oversewing of the bladder.  Procedure discussed with patient detail.  Risks, benefits and possible case include but not limited to: Bleeding, infection injury to intra-abdominal structures.  Potential need for ostomy.  We will also ask Dr. Richardo Hanks from urology to place ICG within the ureter and be available in case we need to perform a repair of the bladder. Extensive counseling provided   Greater than 50% of the 45 minutes  visit was spent in counseling/coordination of  care   Sterling Big, MD [facility] General Surgeon

## 2020-06-09 NOTE — Telephone Encounter (Signed)
Outgoing call is made, left message for patient to call.  Please advise patient of Pre-Admission date/time, COVID Testing date and Surgery date.  Surgery Date: 06/20/20 Preadmission Testing Date: 06/12/20 (phone 1p-5p) Covid Testing Date: 06/18/20 @ 8:10 am, patient [facility] to [facility] to [facility] ([address] [facility])   Patient has been made aware to call [phone], between 1-3:00pm the day before surgery, to find out what time to arrive for surgery.

## 2020-06-09 NOTE — Progress Notes (Signed)
Outpatient Surgical Follow Up  06/09/2020  Teresa Salazar is an 60 y.o. female.   Chief Complaint  Patient presents with  . Follow-up    HPI: Is a 60 year old female well-known to me with a history of colovesical fistula and abscess.  Treated with antibiotic therapy.  She is doing better.  She also had a kidney stone and had a recent cystoscopy and stent placement.  She feels much better.  Now her blood pressure is better controlled.  No fevers no chills. She  did have CT scan showing evidence of a colovesical fistula, I did personally review the images.  He also had adrenal adenomas.  Past Medical History:  Diagnosis Date  . Colovesical fistula 2022  . Diverticulitis   . Elevated blood pressure, situational    h/o -has not been on bp meds in years due to bp control  . GERD (gastroesophageal reflux disease)   . History of kidney stones   . Kidney stone     Past Surgical History:  Procedure Laterality Date  . CESAREAN SECTION  1985  . CYSTOSCOPY/URETEROSCOPY/HOLMIUM LASER/STENT PLACEMENT Left 05/02/2020   Procedure: CYSTOSCOPY/URETEROSCOPY/HOLMIUM LASER/STENT PLACEMENT;  Surgeon: Sondra Come, MD;  Location: [facility] ORS;  Service: Urology;  Laterality: Left;    Family History  Problem Relation Age of Onset  . Diabetes Mother   . Diabetes Father     Social History:  reports that she has been smoking cigarettes. She has a 7.50 pack-year smoking history. She has never used smokeless tobacco. She reports that she does not drink alcohol and does not use drugs.  Allergies: No Known Allergies  Medications reviewed.    ROS Full ROS performed and is otherwise negative other than what is stated in HPI   BP (!) 170/100   Pulse 90   Temp 98.5 F (36.9 C)   Ht 5\' 1"  (1.549 m)   Wt 111 lb 6.4 oz (50.5 kg)   SpO2 97%   BMI 21.05 kg/m   Physical Exam Vitals and nursing note reviewed. Exam conducted with a chaperone present.  Constitutional:      Appearance: Normal  appearance. She is normal weight. She is not ill-appearing.  Eyes:     General:        Right eye: No discharge.        Left eye: No discharge.  Cardiovascular:     Rate and Rhythm: Normal rate and regular rhythm.     Heart sounds: No murmur heard.   Pulmonary:     Effort: Pulmonary effort is normal. No respiratory distress.     Breath sounds: No stridor. No wheezing or rhonchi.  Abdominal:     General: Abdomen is flat. There is no distension.     Palpations: Abdomen is soft. There is no mass.     Tenderness: There is no abdominal tenderness. There is no guarding or rebound.     Hernia: No hernia is present.  Musculoskeletal:     Cervical back: Normal range of motion and neck supple. No rigidity or tenderness.  Lymphadenopathy:     Cervical: No cervical adenopathy.  Skin:    General: Skin is warm and dry.     Capillary Refill: Capillary refill takes less than 2 seconds.  Neurological:     General: No focal deficit present.     Mental Status: She is alert and oriented to person, place, and time.  Psychiatric:        Mood and Affect: Mood normal.  Behavior: Behavior normal.        Thought Content: Thought content normal.        Judgment: Judgment normal.        Assessment/Plan: 59-year-old female with colovesical fistula.  Currently seems to be optimized.  Not septic.  Still having some recurrent UTIs. Cussed with her that the ultimate fix will be to perform sigmoid colectomy and potential oversewing of the bladder.  Procedure discussed with patient detail.  Risks, benefits and possible case include but not limited to: Bleeding, infection injury to intra-abdominal structures.  Potential need for ostomy.  We will also ask Dr. Sninsky from urology to place ICG within the ureter and be available in case we need to perform a repair of the bladder. Extensive counseling provided   Greater than 50% of the 45 minutes  visit was spent in counseling/coordination of  care   Artha Chiasson, MD FACS General Surgeon 

## 2020-06-12 ENCOUNTER — Other Ambulatory Visit: Payer: Self-pay

## 2020-06-12 ENCOUNTER — Other Ambulatory Visit
Admission: RE | Admit: 2020-06-12 | Discharge: 2020-06-12 | Disposition: A | Discharge status: Home / Self Care | Payer: Self-pay | Source: Ambulatory Visit | Attending: Surgery | Admitting: Surgery

## 2020-06-12 NOTE — Patient Instructions (Addendum)
Your procedure is scheduled on: June 20, 2020 FRIDAY Report to the Registration Desk on the 1st floor of the [facility] To find out your arrival time, please call [phone] between 1PM - 3PM on: June 19, 2020 THURSDAY  REMEMBER: Instructions that are not followed completely may result in serious medical risk, up to and including death; or upon the discretion of your surgeon and anesthesiologist your surgery may need to be rescheduled.  Do not eat food after midnight the night before surgery.  No gum chewing, lozengers or hard candies.  You may however, drink CLEAR liquids up to 2 hours before you are scheduled to arrive for your surgery. Do not drink anything within 2 hours of your scheduled arrival time.  Clear liquids include: - water  - apple juice without pulp - gatorade (not RED, PURPLE, OR BLUE) - black coffee or tea (Do NOT add milk or creamers to the coffee or tea) Do NOT drink anything that is not on this list.  TAKE THESE MEDICATIONS THE MORNING OF SURGERY WITH A SIP OF WATER: NONE  One week prior to surgery: Stop Anti-inflammatories (NSAIDS) such as Advil, Aleve, Ibuprofen, Motrin, Naproxen, Naprosyn and ASPIRIN OR Aspirin based products such as Excedrin, Goodys Powder, BC Powder. Stop ANY OVER THE COUNTER supplements until after surgery. You may however, continue to take Tylenol if needed for pain up until the day of surgery.  No Alcohol for 24 hours before or after surgery.  No Smoking including e-cigarettes for 24 hours prior to surgery.  No chewable tobacco products for at least 6 hours prior to surgery.  No nicotine patches on the day of surgery.  Do not use any "recreational" drugs for at least a week prior to your surgery.  Please be advised that the combination of cocaine and anesthesia may have negative outcomes, up to and including death. If you test positive for cocaine, your surgery will be cancelled.  On the morning of surgery brush your teeth with  toothpaste and water, you may rinse your mouth with mouthwash if you wish. Do not swallow any toothpaste or mouthwash.  Do not wear jewelry, make-up, hairpins, clips or nail polish.  Do not wear lotions, powders, or perfumes OR DEODORANT   Do not shave body from the neck down 48 hours prior to surgery just in case you cut yourself which could leave a site for infection.  Also, freshly shaved skin may become irritated if using the CHG soap.  Contact lenses, hearing aids and dentures may not be worn into surgery.  Do not bring valuables to the hospital. [facility] is not responsible for any missing/lost belongings or valuables.   Use CHG Soap as directed on instruction sheet.  BOWEL PREP AS INSTRUCTED  Notify your doctor if there is any change in your medical condition (cold, fever, infection).  Wear comfortable clothing (specific to your surgery type) to the hospital.  Plan for stool softeners for home use; pain medications have a tendency to cause constipation. You can also help prevent constipation by eating foods high in fiber such as fruits and vegetables and drinking plenty of fluids as your diet allows.  After surgery, you can help prevent lung complications by doing breathing exercises.  Take deep breaths and cough every 1-2 hours. Your doctor may order a device called an Incentive Spirometer to help you take deep breaths. When coughing or sneezing, hold a pillow firmly against your incision with both hands. This is called "splinting." Doing this  helps protect your incision. It also decreases belly discomfort.  If you are being admitted to the hospital overnight, MAY BRING A BAG WITH YOU TO THE HOSPITAL  If you are being discharged the day of surgery, you will not be allowed to drive home. You will need a responsible adult (18 years or older) to drive you home and stay with you that night.   If you are taking public transportation, you will need to have a responsible adult  (18 years or older) with you. Please confirm with your physician that it is acceptable to use public transportation.   Please call the Pre-admissions Testing Dept. at [phone] if you have any questions about these instructions.  Surgery Visitation Policy:  Patients undergoing a surgery or procedure may have one family member or support person with them as long as that person is not COVID-19 positive or experiencing its symptoms.  That person may remain in the waiting area during the procedure.  Inpatient Visitation:    Visiting hours are 7 a.m. to 8 p.m. Inpatients will be allowed two visitors daily. The visitors may change each day during the patient's stay. No visitors under the age of 98. Any visitor under the age of 76 must be accompanied by an adult. The visitor must pass COVID-19 screenings, use hand sanitizer when entering and exiting the patient's room and wear a mask at all times, including in the patient's room. Patients must also wear a mask when staff or their visitor are in the room. Masking is required regardless of vaccination status.  Total Hip/Knee Replacement Preoperative Educational Video  To better prepare for surgery, please view our videos that explain the physical activity and discharge planning required to have the best surgical recovery at [facility].  [email or web address]      Questions? Call [phone] or email [email or web address]

## 2020-06-17 ENCOUNTER — Encounter
Admission: RE | Admit: 2020-06-17 | Discharge: 2020-06-17 | Disposition: A | Discharge status: Home / Self Care | Payer: Self-pay | Source: Ambulatory Visit | Attending: Surgery | Admitting: Surgery

## 2020-06-17 ENCOUNTER — Ambulatory Visit
Admission: RE | Admit: 2020-06-17 | Discharge: 2020-06-17 | Disposition: A | Discharge status: Home / Self Care | Payer: Self-pay | Source: Ambulatory Visit | Attending: Urology | Admitting: Urology

## 2020-06-17 ENCOUNTER — Other Ambulatory Visit: Payer: Self-pay

## 2020-06-17 DIAGNOSIS — I10 Essential (primary) hypertension: Secondary | ICD-10-CM | POA: Insufficient documentation

## 2020-06-17 DIAGNOSIS — N200 Calculus of kidney: Secondary | ICD-10-CM | POA: Insufficient documentation

## 2020-06-17 DIAGNOSIS — Z20822 Contact with and (suspected) exposure to covid-19: Secondary | ICD-10-CM | POA: Insufficient documentation

## 2020-06-17 DIAGNOSIS — Z01818 Encounter for other preprocedural examination: Secondary | ICD-10-CM | POA: Insufficient documentation

## 2020-06-17 LAB — CBC WITH DIFFERENTIAL/PLATELET
Abs Immature Granulocytes: 0.02 10*3/uL (ref 0.00–0.07)
Basophils Absolute: 0.1 10*3/uL (ref 0.0–0.1)
Basophils Relative: 1 %
Eosinophils Absolute: 0.3 10*3/uL (ref 0.0–0.5)
Eosinophils Relative: 4 %
HCT: 38.1 % (ref 36.0–46.0)
Hemoglobin: 12.2 g/dL (ref 12.0–15.0)
Immature Granulocytes: 0 %
Lymphocytes Relative: 31 %
Lymphs Abs: 2.6 10*3/uL (ref 0.7–4.0)
MCH: 27.7 pg (ref 26.0–34.0)
MCHC: 32 g/dL (ref 30.0–36.0)
MCV: 86.6 fL (ref 80.0–100.0)
Monocytes Absolute: 0.6 10*3/uL (ref 0.1–1.0)
Monocytes Relative: 8 %
Neutro Abs: 4.7 10*3/uL (ref 1.7–7.7)
Neutrophils Relative %: 56 %
Platelets: 284 10*3/uL (ref 150–400)
RBC: 4.4 MIL/uL (ref 3.87–5.11)
RDW: 15.2 % (ref 11.5–15.5)
WBC: 8.3 10*3/uL (ref 4.0–10.5)
nRBC: 0 % (ref 0.0–0.2)

## 2020-06-17 LAB — COMPREHENSIVE METABOLIC PANEL
ALT: 10 U/L (ref 0–44)
AST: 17 U/L (ref 15–41)
Albumin: 3.8 g/dL (ref 3.5–5.0)
Alkaline Phosphatase: 104 U/L (ref 38–126)
Anion gap: 6 (ref 5–15)
BUN: 20 mg/dL (ref 6–20)
CO2: 29 mmol/L (ref 22–32)
Calcium: 9.1 mg/dL (ref 8.9–10.3)
Chloride: 101 mmol/L (ref 98–111)
Creatinine, Ser: 0.65 mg/dL (ref 0.44–1.00)
GFR, Estimated: >60 mL/min (ref >60)
Glucose, Bld: 103 mg/dL — ABNORMAL HIGH (ref 70–99)
Potassium: 3.7 mmol/L (ref 3.5–5.1)
Sodium: 136 mmol/L (ref 135–145)
Total Bilirubin: 0.4 mg/dL (ref 0.3–1.2)
Total Protein: 7.5 g/dL (ref 6.5–8.1)

## 2020-06-17 LAB — TYPE AND SCREEN
ABO/RH(D): B POS
Antibody Screen: NEGATIVE

## 2020-06-17 LAB — PROTIME-INR
INR: 0.9 (ref 0.8–1.2)
Prothrombin Time: 12.3 seconds (ref 11.4–15.2)

## 2020-06-18 ENCOUNTER — Other Ambulatory Visit: Admission: RE | Admit: 2020-06-18 | Payer: Self-pay | Source: Ambulatory Visit

## 2020-06-18 ENCOUNTER — Telehealth: Payer: Self-pay

## 2020-06-18 LAB — SARS CORONAVIRUS 2 (TAT 6-24 HRS): SARS Coronavirus 2: NEGATIVE

## 2020-06-18 NOTE — Telephone Encounter (Signed)
-----   Message from Sondra Come, MD sent at 06/18/2020 10:16 AM EDT ----- Randie Heinz news, no blockage in kidneys, no remaining kidney stones, keep follow-up as scheduled with Dr. Leonia Reader, MD 06/18/2020

## 2020-06-18 NOTE — Telephone Encounter (Signed)
Left detailed message notifying patient of results per DPR.

## 2020-06-20 ENCOUNTER — Encounter: Payer: Self-pay | Admitting: Surgery

## 2020-06-20 ENCOUNTER — Encounter
Admission: RE | Disposition: A | Discharge status: Home / Self Care | Payer: Self-pay | Source: Home / Self Care | Attending: Surgery

## 2020-06-20 ENCOUNTER — Inpatient Hospital Stay
Admission: RE | Admit: 2020-06-20 | Discharge: 2020-06-22 | DRG: 330 | Disposition: A | Discharge status: Home / Self Care | Payer: Self-pay | Attending: Surgery | Admitting: Surgery

## 2020-06-20 ENCOUNTER — Inpatient Hospital Stay: Payer: Self-pay | Admitting: Anesthesiology

## 2020-06-20 ENCOUNTER — Other Ambulatory Visit: Payer: Self-pay

## 2020-06-20 DIAGNOSIS — K632 Fistula of intestine: Principal | ICD-10-CM | POA: Diagnosis present

## 2020-06-20 DIAGNOSIS — Z20822 Contact with and (suspected) exposure to covid-19: Secondary | ICD-10-CM | POA: Diagnosis present

## 2020-06-20 DIAGNOSIS — R310 Gross hematuria: Secondary | ICD-10-CM | POA: Diagnosis present

## 2020-06-20 DIAGNOSIS — K5780 Diverticulitis of intestine, part unspecified, with perforation and abscess without bleeding: Secondary | ICD-10-CM

## 2020-06-20 DIAGNOSIS — Z9049 Acquired absence of other specified parts of digestive tract: Secondary | ICD-10-CM

## 2020-06-20 DIAGNOSIS — K5720 Diverticulitis of large intestine with perforation and abscess without bleeding: Secondary | ICD-10-CM | POA: Diagnosis present

## 2020-06-20 DIAGNOSIS — N321 Vesicointestinal fistula: Secondary | ICD-10-CM

## 2020-06-20 DIAGNOSIS — K660 Peritoneal adhesions (postprocedural) (postinfection): Secondary | ICD-10-CM | POA: Diagnosis present

## 2020-06-20 DIAGNOSIS — F17210 Nicotine dependence, cigarettes, uncomplicated: Secondary | ICD-10-CM | POA: Diagnosis present

## 2020-06-20 DIAGNOSIS — Z87440 Personal history of urinary (tract) infections: Secondary | ICD-10-CM

## 2020-06-20 DIAGNOSIS — Z833 Family history of diabetes mellitus: Secondary | ICD-10-CM

## 2020-06-20 DIAGNOSIS — N823 Fistula of vagina to large intestine: Secondary | ICD-10-CM | POA: Diagnosis present

## 2020-06-20 DIAGNOSIS — K5700 Diverticulitis of small intestine with perforation and abscess without bleeding: Secondary | ICD-10-CM | POA: Diagnosis present

## 2020-06-20 DIAGNOSIS — K5730 Diverticulosis of large intestine without perforation or abscess without bleeding: Secondary | ICD-10-CM

## 2020-06-20 LAB — CREATININE, SERUM
Creatinine, Ser: 0.63 mg/dL (ref 0.44–1.00)
GFR, Estimated: >60 mL/min (ref >60)

## 2020-06-20 LAB — CBC
HCT: 38.1 % (ref 36.0–46.0)
Hemoglobin: 12.3 g/dL (ref 12.0–15.0)
MCH: 28 pg (ref 26.0–34.0)
MCHC: 32.3 g/dL (ref 30.0–36.0)
MCV: 86.8 fL (ref 80.0–100.0)
Platelets: 265 10*3/uL (ref 150–400)
RBC: 4.39 MIL/uL (ref 3.87–5.11)
RDW: 15.3 % (ref 11.5–15.5)
WBC: 16.5 10*3/uL — ABNORMAL HIGH (ref 4.0–10.5)
nRBC: 0 % (ref 0.0–0.2)

## 2020-06-20 LAB — ABO/RH: ABO/RH(D): B POS

## 2020-06-20 SURGERY — COLECTOMY, SIGMOID, ROBOT-ASSISTED
Anesthesia: General

## 2020-06-20 MED ORDER — PANTOPRAZOLE SODIUM 40 MG IV SOLR
40.0000 mg | Freq: Every day | INTRAVENOUS | Status: DC
  Administered 2020-06-20 – 2020-06-21 (×2): 40 mg via INTRAVENOUS
  Filled 2020-06-20 (×2): qty 40

## 2020-06-20 MED ORDER — FENTANYL CITRATE (PF) 100 MCG/2ML IJ SOLN
INTRAMUSCULAR | Status: DC | PRN
  Administered 2020-06-20 (×4): 25 ug via INTRAVENOUS

## 2020-06-20 MED ORDER — ONDANSETRON HCL 4 MG/2ML IJ SOLN: 4.0000 mg | Freq: Once | INTRAMUSCULAR | Status: DC | PRN

## 2020-06-20 MED ORDER — BUPIVACAINE-EPINEPHRINE (PF) 0.25% -1:200000 IJ SOLN
INTRAMUSCULAR | Status: AC
  Filled 2020-06-20: qty 30

## 2020-06-20 MED ORDER — KETAMINE HCL 10 MG/ML IJ SOLN
INTRAMUSCULAR | Status: DC | PRN
  Administered 2020-06-20: 10 mg via INTRAVENOUS
  Administered 2020-06-20: 25 mg via INTRAVENOUS
  Administered 2020-06-20 (×2): 10 mg via INTRAVENOUS

## 2020-06-20 MED ORDER — MORPHINE SULFATE (PF) 2 MG/ML IV SOLN
2.0000 mg | INTRAVENOUS | Status: DC | PRN
  Administered 2020-06-20 – 2020-06-21 (×2): 2 mg via INTRAVENOUS
  Filled 2020-06-20 (×2): qty 1

## 2020-06-20 MED ORDER — CHLORHEXIDINE GLUCONATE CLOTH 2 % EX PADS
6.0000 | MEDICATED_PAD | Freq: Once | CUTANEOUS | Status: AC
Start: 2020-06-20 — End: 2020-06-20
  Administered 2020-06-20: 6 via TOPICAL

## 2020-06-20 MED ORDER — INDOCYANINE GREEN 25 MG IV SOLR
INTRAVENOUS | Status: DC | PRN
  Administered 2020-06-20: 10 mg

## 2020-06-20 MED ORDER — STERILE WATER FOR IRRIGATION IR SOLN
Status: DC | PRN
  Administered 2020-06-20: 150 mL via INTRAVESICAL

## 2020-06-20 MED ORDER — SODIUM CHLORIDE (PF) 0.9 % IJ SOLN
INTRAMUSCULAR | Status: AC
  Filled 2020-06-20: qty 50

## 2020-06-20 MED ORDER — MIDAZOLAM HCL 2 MG/2ML IJ SOLN
INTRAMUSCULAR | Status: AC
  Filled 2020-06-20: qty 2

## 2020-06-20 MED ORDER — MELATONIN 3 MG PO TABS
3.0000 mg | ORAL_TABLET | Freq: Every evening | ORAL | Status: DC | PRN
  Filled 2020-06-20: qty 1

## 2020-06-20 MED ORDER — ENOXAPARIN SODIUM 40 MG/0.4ML IJ SOSY
40.0000 mg | PREFILLED_SYRINGE | INTRAMUSCULAR | Status: DC
  Administered 2020-06-21 – 2020-06-22 (×2): 40 mg via SUBCUTANEOUS
  Filled 2020-06-20 (×2): qty 0.4

## 2020-06-20 MED ORDER — KETOROLAC TROMETHAMINE 30 MG/ML IJ SOLN
30.0000 mg | Freq: Four times a day (QID) | INTRAMUSCULAR | Status: DC
  Administered 2020-06-20 – 2020-06-22 (×8): 30 mg via INTRAVENOUS
  Filled 2020-06-20 (×8): qty 1

## 2020-06-20 MED ORDER — SODIUM CHLORIDE 0.9 % IV SOLN
2.0000 g | Freq: Three times a day (TID) | INTRAVENOUS | Status: DC
  Filled 2020-06-20 (×3): qty 2

## 2020-06-20 MED ORDER — CHLORHEXIDINE GLUCONATE CLOTH 2 % EX PADS
6.0000 | MEDICATED_PAD | Freq: Every day | CUTANEOUS | Status: DC
  Administered 2020-06-21: 6 via TOPICAL

## 2020-06-20 MED ORDER — HYDRALAZINE HCL 20 MG/ML IJ SOLN: 10.0000 mg | INTRAMUSCULAR | Status: DC | PRN

## 2020-06-20 MED ORDER — LACTATED RINGERS IV SOLN: INTRAVENOUS | Status: DC | PRN

## 2020-06-20 MED ORDER — BUPIVACAINE LIPOSOME 1.3 % IJ SUSP
INTRAMUSCULAR | Status: AC
  Filled 2020-06-20: qty 20

## 2020-06-20 MED ORDER — ONDANSETRON HCL 4 MG/2ML IJ SOLN: 4.0000 mg | Freq: Four times a day (QID) | INTRAMUSCULAR | Status: DC | PRN

## 2020-06-20 MED ORDER — SODIUM CHLORIDE 0.9 % IV SOLN
INTRAVENOUS | Status: DC | PRN
  Administered 2020-06-20: 10 ug/min via INTRAVENOUS

## 2020-06-20 MED ORDER — LACTATED RINGERS IV SOLN: INTRAVENOUS | Status: DC

## 2020-06-20 MED ORDER — DEXAMETHASONE SODIUM PHOSPHATE 10 MG/ML IJ SOLN
INTRAMUSCULAR | Status: DC | PRN
  Administered 2020-06-20: 5 mg via INTRAVENOUS

## 2020-06-20 MED ORDER — SODIUM CHLORIDE 0.9 % IV SOLN
2.0000 g | Freq: Three times a day (TID) | INTRAVENOUS | Status: DC
  Filled 2020-06-20 (×4): qty 2

## 2020-06-20 MED ORDER — SODIUM CHLORIDE 0.9 % IV SOLN
2.0000 g | INTRAVENOUS | Status: AC
  Administered 2020-06-20: 2 g via INTRAVENOUS
  Filled 2020-06-20: qty 2

## 2020-06-20 MED ORDER — MIDAZOLAM HCL 2 MG/2ML IJ SOLN
INTRAMUSCULAR | Status: DC | PRN
  Administered 2020-06-20 (×2): 1 mg via INTRAVENOUS

## 2020-06-20 MED ORDER — SODIUM CHLORIDE 0.9 % IV SOLN: 1.0000 g | Freq: Three times a day (TID) | INTRAVENOUS | Status: DC

## 2020-06-20 MED ORDER — SODIUM CHLORIDE 0.9 % IV SOLN: INTRAVENOUS | Status: DC

## 2020-06-20 MED ORDER — HEPARIN SODIUM (PORCINE) 5000 UNIT/ML IJ SOLN
INTRAMUSCULAR | Status: AC
  Administered 2020-06-20: 5000 [IU] via SUBCUTANEOUS
  Filled 2020-06-20: qty 1

## 2020-06-20 MED ORDER — LIDOCAINE HCL (CARDIAC) PF 100 MG/5ML IV SOSY
PREFILLED_SYRINGE | INTRAVENOUS | Status: DC | PRN
  Administered 2020-06-20: 40 mg via INTRAVENOUS

## 2020-06-20 MED ORDER — HEPARIN SODIUM (PORCINE) 5000 UNIT/ML IJ SOLN: 5000.0000 [IU] | Freq: Once | INTRAMUSCULAR | Status: AC

## 2020-06-20 MED ORDER — ACETAMINOPHEN 500 MG PO TABS
ORAL_TABLET | ORAL | Status: AC
  Administered 2020-06-20: 1000 mg via ORAL
  Filled 2020-06-20: qty 2

## 2020-06-20 MED ORDER — ONDANSETRON 4 MG PO TBDP: 4.0000 mg | ORAL_TABLET | Freq: Four times a day (QID) | ORAL | Status: DC | PRN

## 2020-06-20 MED ORDER — DIPHENHYDRAMINE HCL 50 MG/ML IJ SOLN: 12.5000 mg | Freq: Four times a day (QID) | INTRAMUSCULAR | Status: DC | PRN

## 2020-06-20 MED ORDER — MORPHINE SULFATE (PF) 2 MG/ML IV SOLN
INTRAVENOUS | Status: AC
  Administered 2020-06-20: 2 mg via INTRAVENOUS
  Filled 2020-06-20: qty 1

## 2020-06-20 MED ORDER — METHYLENE BLUE 0.5 % INJ SOLN
INTRAVENOUS | Status: AC
  Filled 2020-06-20: qty 10

## 2020-06-20 MED ORDER — SODIUM CHLORIDE 0.9 % IV SOLN
INTRAVENOUS | Status: DC | PRN
  Administered 2020-06-20: 70 mL

## 2020-06-20 MED ORDER — LIDOCAINE 2% (20 MG/ML) 5 ML SYRINGE
INTRAMUSCULAR | Status: DC | PRN
  Administered 2020-06-20: 1 mg/kg/h via INTRAVENOUS

## 2020-06-20 MED ORDER — SUGAMMADEX SODIUM 200 MG/2ML IV SOLN
INTRAVENOUS | Status: DC | PRN
  Administered 2020-06-20 (×4): 50 mg via INTRAVENOUS

## 2020-06-20 MED ORDER — FENTANYL CITRATE (PF) 100 MCG/2ML IJ SOLN
INTRAMUSCULAR | Status: AC
  Filled 2020-06-20: qty 2

## 2020-06-20 MED ORDER — PROCHLORPERAZINE MALEATE 10 MG PO TABS
10.0000 mg | ORAL_TABLET | Freq: Four times a day (QID) | ORAL | Status: DC | PRN
  Filled 2020-06-20: qty 1

## 2020-06-20 MED ORDER — PROCHLORPERAZINE EDISYLATE 10 MG/2ML IJ SOLN: 5.0000 mg | Freq: Four times a day (QID) | INTRAMUSCULAR | Status: DC | PRN

## 2020-06-20 MED ORDER — CELECOXIB 200 MG PO CAPS: 200.0000 mg | ORAL_CAPSULE | ORAL | Status: AC

## 2020-06-20 MED ORDER — CELECOXIB 200 MG PO CAPS
ORAL_CAPSULE | ORAL | Status: AC
  Administered 2020-06-20: 200 mg via ORAL
  Filled 2020-06-20: qty 1

## 2020-06-20 MED ORDER — GABAPENTIN 300 MG PO CAPS: 300.0000 mg | ORAL_CAPSULE | ORAL | Status: AC

## 2020-06-20 MED ORDER — GABAPENTIN 300 MG PO CAPS
ORAL_CAPSULE | ORAL | Status: AC
  Administered 2020-06-20: 300 mg via ORAL
  Filled 2020-06-20: qty 1

## 2020-06-20 MED ORDER — FENTANYL CITRATE (PF) 100 MCG/2ML IJ SOLN: 25.0000 ug | INTRAMUSCULAR | Status: DC | PRN

## 2020-06-20 MED ORDER — CHLORHEXIDINE GLUCONATE 0.12 % MT SOLN: 15.0000 mL | Freq: Once | OROMUCOSAL | Status: AC

## 2020-06-20 MED ORDER — PHENYLEPHRINE HCL (PRESSORS) 10 MG/ML IV SOLN
INTRAVENOUS | Status: DC | PRN
  Administered 2020-06-20 (×2): 50 ug via INTRAVENOUS
  Administered 2020-06-20: 100 ug via INTRAVENOUS
  Administered 2020-06-20: 50 ug via INTRAVENOUS

## 2020-06-20 MED ORDER — DIPHENHYDRAMINE HCL 12.5 MG/5ML PO ELIX
12.5000 mg | ORAL_SOLUTION | Freq: Four times a day (QID) | ORAL | Status: DC | PRN
  Filled 2020-06-20: qty 5

## 2020-06-20 MED ORDER — INDOCYANINE GREEN 25 MG IV SOLR
INTRAVENOUS | Status: DC | PRN
  Administered 2020-06-20: 5 mg via INTRAVENOUS

## 2020-06-20 MED ORDER — ACETAMINOPHEN 500 MG PO TABS: 1000.0000 mg | ORAL_TABLET | ORAL | Status: AC

## 2020-06-20 MED ORDER — KETAMINE HCL 50 MG/ML IJ SOLN
INTRAMUSCULAR | Status: AC
  Filled 2020-06-20: qty 1

## 2020-06-20 MED ORDER — PROPOFOL 10 MG/ML IV BOLUS
INTRAVENOUS | Status: DC | PRN
  Administered 2020-06-20: 90 mg via INTRAVENOUS
  Administered 2020-06-20: 60 mg via INTRAVENOUS

## 2020-06-20 MED ORDER — FAMOTIDINE 20 MG PO TABS: 20.0000 mg | ORAL_TABLET | Freq: Once | ORAL | Status: DC

## 2020-06-20 MED ORDER — MEPERIDINE HCL 25 MG/ML IJ SOLN: 6.2500 mg | INTRAMUSCULAR | Status: DC | PRN

## 2020-06-20 MED ORDER — ROCURONIUM BROMIDE 10 MG/ML (PF) SYRINGE
PREFILLED_SYRINGE | INTRAVENOUS | Status: AC
  Filled 2020-06-20: qty 10

## 2020-06-20 MED ORDER — ORAL CARE MOUTH RINSE: 15.0000 mL | Freq: Once | OROMUCOSAL | Status: AC

## 2020-06-20 MED ORDER — SODIUM CHLORIDE 0.9 % IV SOLN
2.0000 g | Freq: Three times a day (TID) | INTRAVENOUS | Status: DC
  Administered 2020-06-20 – 2020-06-22 (×5): 2 g via INTRAVENOUS
  Filled 2020-06-20 (×9): qty 2

## 2020-06-20 MED ORDER — ROCURONIUM BROMIDE 100 MG/10ML IV SOLN
INTRAVENOUS | Status: DC | PRN
  Administered 2020-06-20 (×2): 10 mg via INTRAVENOUS
  Administered 2020-06-20 (×2): 20 mg via INTRAVENOUS
  Administered 2020-06-20: 50 mg via INTRAVENOUS
  Administered 2020-06-20 (×2): 10 mg via INTRAVENOUS

## 2020-06-20 MED ORDER — ACETAMINOPHEN 500 MG PO TABS
1000.0000 mg | ORAL_TABLET | Freq: Four times a day (QID) | ORAL | Status: DC
  Administered 2020-06-20 – 2020-06-22 (×8): 1000 mg via ORAL
  Filled 2020-06-20 (×8): qty 2

## 2020-06-20 MED ORDER — CHLORHEXIDINE GLUCONATE 0.12 % MT SOLN
OROMUCOSAL | Status: AC
  Administered 2020-06-20: 15 mL via OROMUCOSAL
  Filled 2020-06-20: qty 15

## 2020-06-20 MED ORDER — BUPIVACAINE-EPINEPHRINE 0.25% -1:200000 IJ SOLN
INTRAMUSCULAR | Status: DC | PRN
  Administered 2020-06-20: 20 mL

## 2020-06-20 MED ORDER — ONDANSETRON HCL 4 MG/2ML IJ SOLN
INTRAMUSCULAR | Status: DC | PRN
  Administered 2020-06-20: 4 mg via INTRAVENOUS

## 2020-06-20 MED ORDER — AMLODIPINE BESYLATE 10 MG PO TABS
10.0000 mg | ORAL_TABLET | Freq: Every day | ORAL | Status: DC
  Administered 2020-06-21 – 2020-06-22 (×2): 10 mg via ORAL
  Filled 2020-06-20 (×2): qty 1

## 2020-06-20 SURGICAL SUPPLY — 111 items
"PENCIL ELECTRO HAND CTR " (MISCELLANEOUS) ×1 IMPLANT
BAG DRAIN CYSTO-URO LG1000N (MISCELLANEOUS) ×2 IMPLANT
BAG LAPAROSCOPIC 12 15 PORT 16 (BASKET) IMPLANT
BAG RETRIEVAL 12/15 (BASKET) ×2
CANISTER SUCT 1200ML W/VALVE (MISCELLANEOUS) ×2 IMPLANT
CANNULA REDUC XI 12-8 STAPL (CANNULA) ×1
CANNULA REDUCER 12-8 DVNC XI (CANNULA) ×1 IMPLANT
CATH FOLEY 2WAY  5CC 16FR (CATHETERS) ×1
CATH ROBINSON RED A/P 16FR (CATHETERS) ×2 IMPLANT
CATH URETL 5X70 OPEN END (CATHETERS) ×2 IMPLANT
CATH URTH 16FR FL 2W BLN LF (CATHETERS) ×1 IMPLANT
CHLORAPREP W/TINT 26 (MISCELLANEOUS) ×2 IMPLANT
COVER MAYO STAND REUSABLE (DRAPES) ×2 IMPLANT
COVER TIP SHEARS 8 DVNC (MISCELLANEOUS) ×1 IMPLANT
COVER TIP SHEARS 8MM DA VINCI (MISCELLANEOUS) ×1
COVER WAND RF STERILE (DRAPES) ×2 IMPLANT
DECANTER SPIKE VIAL GLASS SM (MISCELLANEOUS) ×2 IMPLANT
DEFOGGER SCOPE WARMER CLEARIFY (MISCELLANEOUS) ×2 IMPLANT
DERMABOND ADVANCED (GAUZE/BANDAGES/DRESSINGS) ×1
DERMABOND ADVANCED .7 DNX12 (GAUZE/BANDAGES/DRESSINGS) ×1 IMPLANT
DRAPE 3/4 80X56 (DRAPES) ×2 IMPLANT
DRAPE ARM DVNC X/XI (DISPOSABLE) ×4 IMPLANT
DRAPE COLUMN DVNC XI (DISPOSABLE) ×1 IMPLANT
DRAPE DA VINCI XI ARM (DISPOSABLE) ×4
DRAPE DA VINCI XI COLUMN (DISPOSABLE) ×1
DRAPE LEGGINS SURG 28X43 STRL (DRAPES) ×2 IMPLANT
DRAPE UNDER BUTTOCK W/FLU (DRAPES) ×2 IMPLANT
ELECT BLADE 6.5 EXT (BLADE) ×2 IMPLANT
ELECT CAUTERY BLADE 6.4 (BLADE) ×2 IMPLANT
ELECT REM PT RETURN 9FT ADLT (ELECTROSURGICAL) ×2
ELECTRODE REM PT RTRN 9FT ADLT (ELECTROSURGICAL) ×1 IMPLANT
GLOVE SURG ENC MOIS LTX SZ7 (GLOVE) ×6 IMPLANT
GLOVE SURG UNDER POLY LF SZ7.5 (GLOVE) ×3 IMPLANT
GOWN STRL REUS W/ TWL LRG LVL3 (GOWN DISPOSABLE) ×6 IMPLANT
GOWN STRL REUS W/TWL LRG LVL3 (GOWN DISPOSABLE) ×10
GRASPER LAPSCPC 5X45 DSP (INSTRUMENTS) ×2 IMPLANT
HANDLE YANKAUER SUCT BULB TIP (MISCELLANEOUS) ×2 IMPLANT
IRRIGATION STRYKERFLOW (MISCELLANEOUS) ×1 IMPLANT
IRRIGATOR STRYKERFLOW (MISCELLANEOUS) ×4
IV NS 1000ML (IV SOLUTION) ×2
IV NS 1000ML BAXH (IV SOLUTION) ×1 IMPLANT
IV NS IRRIG 3000ML ARTHROMATIC (IV SOLUTION) ×2 IMPLANT
KIT IMAGING PINPOINTPAQ (MISCELLANEOUS) ×7 IMPLANT
KIT PINK PAD W/HEAD ARE REST (MISCELLANEOUS) ×2
KIT PINK PAD W/HEAD ARM REST (MISCELLANEOUS) ×1 IMPLANT
LABEL OR SOLS (LABEL) ×2 IMPLANT
MANIFOLD NEPTUNE II (INSTRUMENTS) ×4 IMPLANT
NEEDLE HYPO 22GX1.5 SAFETY (NEEDLE) ×2 IMPLANT
NS IRRIG 500ML POUR BTL (IV SOLUTION) ×2 IMPLANT
OBTURATOR OPTICAL STANDARD 8MM (TROCAR) ×1
OBTURATOR OPTICAL STND 8 DVNC (TROCAR) ×1
OBTURATOR OPTICALSTD 8 DVNC (TROCAR) ×1 IMPLANT
PACK COLON CLEAN CLOSURE (MISCELLANEOUS) ×2 IMPLANT
PACK CYSTO AR (MISCELLANEOUS) ×2 IMPLANT
PACK LAP CHOLECYSTECTOMY (MISCELLANEOUS) ×2 IMPLANT
PAD PREP 24X41 OB/GYN DISP (PERSONAL CARE ITEMS) ×2 IMPLANT
PENCIL ELECTRO HAND CTR (MISCELLANEOUS) ×2 IMPLANT
PORT ACCESS TROCAR AIRSEAL 5 (TROCAR) ×2 IMPLANT
RELOAD STAPLE 45 3.5 BLU DVNC (STAPLE) IMPLANT
RELOAD STAPLE 60 2.5 WHT DVNC (STAPLE) IMPLANT
RELOAD STAPLE 60 3.5 BLU DVNC (STAPLE) IMPLANT
RELOAD STAPLER 2.5X60 WHT DVNC (STAPLE) IMPLANT
RELOAD STAPLER 3.5X45 BLU DVNC (STAPLE) IMPLANT
RELOAD STAPLER 3.5X60 BLU DVNC (STAPLE) ×2 IMPLANT
SEAL CANN UNIV 5-8 DVNC XI (MISCELLANEOUS) ×3 IMPLANT
SEAL XI 5MM-8MM UNIVERSAL (MISCELLANEOUS) ×3
SEALER VESSEL DA VINCI XI (MISCELLANEOUS) ×1
SEALER VESSEL EXT DVNC XI (MISCELLANEOUS) ×1 IMPLANT
SET CYSTO W/LG BORE CLAMP LF (SET/KITS/TRAYS/PACK) ×2 IMPLANT
SET TRI-LUMEN FLTR TB AIRSEAL (TUBING) ×4 IMPLANT
SOL PREP PVP 2OZ (MISCELLANEOUS) ×2
SOLUTION ELECTROLUBE (MISCELLANEOUS) ×2 IMPLANT
SOLUTION PREP PVP 2OZ (MISCELLANEOUS) ×1 IMPLANT
SPONGE LAP 18X18 RF (DISPOSABLE) ×4 IMPLANT
SPONGE LAP 4X18 RFD (DISPOSABLE) ×2 IMPLANT
STAPLER 45 DA VINCI SURE FORM (STAPLE)
STAPLER 45 SUREFORM DVNC (STAPLE) IMPLANT
STAPLER 60 DA VINCI SURE FORM (STAPLE) ×1
STAPLER 60 SUREFORM DVNC (STAPLE) IMPLANT
STAPLER CANNULA SEAL DVNC XI (STAPLE) ×1 IMPLANT
STAPLER CANNULA SEAL XI (STAPLE) ×1
STAPLER CIRCULAR MANUAL XL 25 (STAPLE) IMPLANT
STAPLER CIRCULAR MANUAL XL 29 (STAPLE) IMPLANT
STAPLER CIRCULAR MANUAL XL 33 (STAPLE) ×1 IMPLANT
STAPLER RELOAD 2.5X60 WHITE (STAPLE)
STAPLER RELOAD 2.5X60 WHT DVNC (STAPLE)
STAPLER RELOAD 3.5X45 BLU DVNC (STAPLE)
STAPLER RELOAD 3.5X45 BLUE (STAPLE)
STAPLER RELOAD 3.5X60 BLU DVNC (STAPLE) ×2
STAPLER RELOAD 3.5X60 BLUE (STAPLE) ×2
SURGILUBE 2OZ TUBE FLIPTOP (MISCELLANEOUS) ×4 IMPLANT
SUT DVC VLOC 3-0 CL 6 P-12 (SUTURE) IMPLANT
SUT MNCRL AB 4-0 PS2 18 (SUTURE) ×2 IMPLANT
SUT PDS AB 0 CT1 27 (SUTURE) ×4 IMPLANT
SUT SILK 2 0 (SUTURE) ×1
SUT SILK 2 0 SH (SUTURE) ×2 IMPLANT
SUT SILK 2 0SH CR/8 30 (SUTURE) ×2 IMPLANT
SUT SILK 2-0 30XBRD TIE 12 (SUTURE) ×1 IMPLANT
SUT VIC AB 2-0 SH 27 (SUTURE) ×1
SUT VIC AB 2-0 SH 27XBRD (SUTURE) ×1 IMPLANT
SUT VICRYL 0 AB UR-6 (SUTURE) ×2 IMPLANT
SUT VLOC 90 S/L VL9 GS22 (SUTURE) IMPLANT
SYR 10ML LL (SYRINGE) ×4 IMPLANT
SYR 20ML LL LF (SYRINGE) ×4 IMPLANT
SYR TOOMEY IRRIG 70ML (MISCELLANEOUS) ×2
SYRINGE TOOMEY IRRIG 70ML (MISCELLANEOUS) ×1 IMPLANT
SYS TROCAR 1.5-3 SLV ABD GEL (ENDOMECHANICALS) ×2
SYSTEM TROCR 1.5-3 SLV ABD GEL (ENDOMECHANICALS) ×1 IMPLANT
TRAY FOLEY MTR SLVR 16FR STAT (SET/KITS/TRAYS/PACK) ×1 IMPLANT
TRAY FOLEY SLVR 16FR LF STAT (SET/KITS/TRAYS/PACK) ×2 IMPLANT
WATER STERILE IRR 1000ML POUR (IV SOLUTION) ×2 IMPLANT

## 2020-06-20 NOTE — Interval H&P Note (Signed)
History and Physical Interval Note:  06/20/2020 7:16 AM  Desloge Sink  has presented today for surgery, with the diagnosis of colovesical fistula.  The various methods of treatment have been discussed with the patient and family. After consideration of risks, benefits and other options for treatment, the patient has consented to  Procedure(s): XI ROBOT ASSISTED SIGMOID COLECTOMY w/RNFA to assist, Dr. Richardo Hanks ICG only no stents (N/A) INDOCYANINE GREEN FLUORESCENCE IMAGING (ICG) (N/A) as a surgical intervention.  The patient's history has been reviewed, patient examined, no change in status, stable for surgery.  I have reviewed the patient's chart and labs.  Questions were answered to the patient's satisfaction.     Zelig Gacek F Alleyne Lac

## 2020-06-20 NOTE — Plan of Care (Signed)
Continuing with plan of care. 

## 2020-06-20 NOTE — H&P (Signed)
UROLOGY H&P UPDATE  Agree with prior H&P dated 06/09/2020 by Dr. Elenor Legato.  Briefly, complex 60 year old female who originally presented with gross hematuria and abdominal pain and CT showed left ureteral stones as well as diverticulitis with abscess and possible colovesical fistula.  She underwent treatment of her left ureteral stones on 05/02/2020, and follow-up renal ultrasound 1 month later showed no hydronephrosis.  Plan today for bilateral ICG to aid in intraoperative identification for Dr. Everlene Farrier.  Cardiac: RRR Lungs: CTA bilaterally  Laterality: Bilateral Procedure: Cystoscopy, bilateral ureteral instillation of ICG  Informed consent obtained, we specifically discussed the risks of bleeding, infection, post-operative pain, need for additional procedures, possible need for catheter placement, ureteral/renal injury.  Sondra Come, MD 06/20/2020

## 2020-06-20 NOTE — Anesthesia Preprocedure Evaluation (Signed)
Anesthesia Evaluation  Patient identified by MRN, date of birth, ID band Patient awake    Reviewed: Allergy & Precautions, NPO status , Patient's Chart, lab work & pertinent test results  History of Anesthesia Complications Negative for: history of anesthetic complications  Airway Mallampati: II  TM Distance: >3 FB Neck ROM: Full    Dental  (+) Missing, Edentulous Upper, Edentulous Lower   Pulmonary neg sleep apnea, neg COPD, Current Smoker and Patient abstained from smoking.,    breath sounds clear to auscultation- rhonchi (-) wheezing      Cardiovascular Exercise Tolerance: Good hypertension, Pt. on medications (-) CAD, (-) Past MI, (-) Cardiac Stents and (-) CABG  Rhythm:Regular Rate:Normal - Systolic murmurs and - Diastolic murmurs    Neuro/Psych neg Seizures negative neurological ROS  negative psych ROS   GI/Hepatic Neg liver ROS, GERD  ,  Endo/Other  negative endocrine ROSneg diabetes  Renal/GU Renal disease     Musculoskeletal negative musculoskeletal ROS (+)   Abdominal (+) - obese,   Peds  Hematology negative hematology ROS (+)   Anesthesia Other Findings . Colovesical fistula 2022 . Diverticulitis  . Elevated blood pressure, situational   h/o -has not been on bp meds in years due to bp control . GERD (gastroesophageal reflux disease)  . History of kidney stones  . Kidney stone     Reproductive/Obstetrics                            Anesthesia Physical  Anesthesia Plan  ASA: 2  Anesthesia Plan: General   Post-op Pain Management:    Induction: Intravenous  PONV Risk Score and Plan: 1 and Ondansetron and Dexamethasone  Airway Management Planned: Oral ETT  Additional Equipment:   Intra-op Plan:   Post-operative Plan: Extubation in OR  Informed Consent: I have reviewed the patients History and Physical, chart, labs and discussed the procedure including the  risks, benefits and alternatives for the proposed anesthesia with the patient or authorized representative who has indicated his/her understanding and acceptance.     Dental advisory given  Plan Discussed with: CRNA, Anesthesiologist and Surgeon  Anesthesia Plan Comments:        Anesthesia Quick Evaluation

## 2020-06-20 NOTE — Op Note (Signed)
PROCEDURES: 1. Robotic assisted Laparoscopic Low anterior resection 2. Robotic Laparoscopic takedown of splenic flexure 3. Repair of serosal tear small bowel caused by small bowel to colon fistula 4. ICG perfusion check of Graft anastomosis   Pre-operative Diagnosis: Recurrent diverticulitis with colovesical fistula  Post-operative Diagnosis:   Surgeon: Merri Ray Fatoumata Albaugh   Assistants: Laqueta Due PA-C ( required for the EEA anastomosis and for exposure)  Anesthesia: General endotracheal anesthesia  ASA Class: 2   Surgeon: Sterling Big , MD FACS  Anesthesia: Gen. 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 No evidence of leak of methylene blue within bladder Preservation of bilateral ureters Tension free anastomosis with very good perfusion and negative intraoperative leak   Estimated Blood Loss: 20cc                Specimens: colon       Complications: none         Condition: stable  Procedure Details  The patient was seen again in the Holding Room. The benefits, complications, treatment options, and expected outcomes were discussed with the patient. The risks of bleeding, infection, recurrence of symptoms, failure to resolve symptoms, anastomotic leak, bowel injury, any of which could require further surgery were reviewed with the patient.   The patient was taken to Operating Room, identified  and the procedure verified.  A Time Out was held and the above information confirmed.  Prior to the induction of general anesthesia, antibiotic prophylaxis was administered. VTE prophylaxis was in place. General endotracheal anesthesia was then administered and tolerated well. After the induction, the abdomen was prepped with Chloraprep and draped in the sterile fashion. The patient was positioned in lithotomy position. 3.5 cm incision was  created Left lower quadrant. The abdominal cavity was entered under direct visualization and the Mini GelPort device was placed and pneumoperitoneum was obtained, no hemodynamic changes were apparent. . Three 8 mm robotic ports were placed under direct visualization and an additional 5 mm assist port was placed. Patient was positioned in steep trendelenburg and left side up. Robot was brought to the field and docked in the standard fashion. WE maintained visualization of our instruments at all times and avoided any collision between arms. I scrubbed out and went to the console. There were dense adhesions from sigmoid to the abdominal wall that where lysed in the standard fashion with the scissors. Significant chronic inflammatory response within the pelvis. The small bowel was plastered to the sigmoid. Meticulous dissection performed with scissors and laparotomy pad. As the result of the diverticulitis and chronic inflammation there was a serosal tear caused by the fusion of the small bowel to the sigmoid due to diverticulitis. Using two interrupted lembert 2-0 silk sutures the bowel serosal tear was repaired. We restore the anatomy of the pelvis and colon after lOA We identified the takeoff of the inferior mesenteric artery dissected the pedicle and divided using vessel sealer  in the standard fashion.   Using the sealer we were able to divide the mesorectum and and also divided the mesentery of the descending colon we mobilized the descending colon IN a medial to lateral fashion. We preserved the ureter at all times. Pelvic dissection was performed in a standard fashion, being in the areolar space anterior to the sacrum. THe mesorectum was divided with the vessel sealer. We dissected the rectum circumferentially and We divided at the mid rectum  with standard 60 mm blue load  The white line of Toldt was identified and divided and  We were also able to mobilize the splenic flexure using scisors in the  standard fashion. The mesentery of the descending colon was divided and we divided the mid descending colon with a blue load 60mm stapler. We used ICG green to make sure the distal descneding colon had good perfusion. We made sure we had adequate reach so the anastomosis could be performed tension free. The specimen was removed in a bag. The robot was undocked and  I scrubbed back in. Under direct visualization the descending colon stump was opened and measured the diameter of the bowel A 33 mm dilator was perfect size. A pursestring was used after inserting the anvil device. Mr. Manus Rudd [facility] was able to pass a 33 mm standard EEA stapler device through the anus and Under direct visualization we performed an end to end anastomosis with the EEA device. A leak test was performed inflating the colon with a Toomey syringe and a rubber catheter. No evidence of leak was observed. There was also adequate hemostasis.   All the laparoscopic ports were removed and a second look showed no evidence of any bleeding or any other injuries.  Liposomal marcaine was infiltrated at all incision sites in a full thickness fashion.  We changed gloves and  close the  abdomen with two -0 PDS sutures in a running fashion to close the anterior and posterior fascia respectively. The skin incisions were closed with 4-0 Monocryl. Dermabond was used to coat all the skin incisions. Needle and laparotomy count were correct and there were no immediate complications.  Sterling Big, MD, FACS

## 2020-06-20 NOTE — Anesthesia Postprocedure Evaluation (Signed)
Anesthesia Post Note  Patient: KINSEY KARCH  Procedure(s) Performed: XI ROBOT ASSISTED SIGMOID COLECTOMY w/RNFA to assist, Dr. Richardo Hanks ICG only no stents INDOCYANINE GREEN FLUORESCENCE IMAGING (ICG)  Patient location during evaluation: PACU Anesthesia Type: General Level of consciousness: awake and alert, awake and oriented Pain management: pain level controlled Vital Signs Assessment: post-procedure vital signs reviewed and stable Respiratory status: spontaneous breathing, nonlabored ventilation and respiratory function stable Cardiovascular status: blood pressure returned to baseline and stable Postop Assessment: no apparent nausea or vomiting Anesthetic complications: no Comments: Sub Q air noted in upper chest, neck, and face (L>R).  Pt asymptomatic.    No notable events documented.   Last Vitals:  Vitals:   06/20/20 1359 06/20/20 1400  BP:    Pulse: 83 84  Resp: 17 15  Temp:    SpO2: 98% 96%    Last Pain:  Vitals:   06/20/20 1330  TempSrc:   PainSc: Asleep                 Manfred Arch

## 2020-06-20 NOTE — Transfer of Care (Signed)
Immediate Anesthesia Transfer of Care Note  Patient: Teresa Salazar  Procedure(s) Performed: XI ROBOT ASSISTED SIGMOID COLECTOMY w/RNFA to assist, Dr. Richardo Hanks ICG only no stents INDOCYANINE GREEN FLUORESCENCE IMAGING (ICG)  Patient Location: PACU  Anesthesia Type:General  Level of Consciousness: awake, drowsy and patient cooperative  Airway & Oxygen Therapy: Patient Spontanous Breathing and Patient connected to face mask oxygen  Post-op Assessment: Report given to RN and Post -op Vital signs reviewed and stable  Post vital signs: Reviewed and stable  Last Vitals:  Vitals Value Taken Time  BP 182/96 06/20/20 1324  Temp    Pulse 87 06/20/20 1325  Resp 18 06/20/20 1325  SpO2 100 % 06/20/20 1325  Vitals shown include unvalidated device data.  Last Pain:  Vitals:   06/20/20 0620  TempSrc: Temporal  PainSc: 0-No pain      Patients Stated Pain Goal: 0 (06/20/20 9702)  Complications: No notable events documented.

## 2020-06-20 NOTE — Anesthesia Procedure Notes (Signed)
Procedure Name: Intubation Date/Time: 06/20/2020 7:46 AM Performed by: Waunita Schooner, RN Pre-anesthesia Checklist: Patient identified, Emergency Drugs available, Suction available and Patient being monitored Patient Re-evaluated:Patient Re-evaluated prior to induction Oxygen Delivery Method: Circle system utilized Preoxygenation: Pre-oxygenation with 100% oxygen Induction Type: IV induction Ventilation: Mask ventilation without difficulty Laryngoscope Size: Miller and 2 Grade View: Grade I Tube type: Oral Tube size: 7.0 mm Number of attempts: 1 Airway Equipment and Method: Stylet and Oral airway Placement Confirmation: ETT inserted through vocal cords under direct vision, positive ETCO2 and breath sounds checked- equal and bilateral Secured at: 21 cm Tube secured with: Tape Dental Injury: Teeth and Oropharynx as per pre-operative assessment

## 2020-06-20 NOTE — Op Note (Signed)
Date of procedure: 06/20/20  Preoperative diagnosis:  Diverticulitis and abscess  Postoperative diagnosis:  Same  Procedure: Cystoscopy, bilateral ureteral instillation of ICG  Surgeon: Legrand Rams, MD  Anesthesia: General  Complications: None  Intraoperative findings:  Normal cystoscopy, ureteral orifices orthotopic, no suspicious lesions or obvious fistula.  Uncomplicated injection of ICG bilaterally  EBL: None for urology portion  Specimens: None for urology portion  Drains: None for urology portion  Indication: Teresa Salazar is a 60 y.o. patient who originally presented with gross hematuria and lower abdominal pain.  CT showed left ureteral stones with hydronephrosis, as well as diverticulitis with abscess and possible colovesical fistula.  There was no definite fistula on cystoscopy, and her ureteral stones have been treated with ureteroscopy and follow-up ultrasound showed no residual hydronephrosis or stones.  Bilateral ureteral ICG was requested by general surgery to aid in intraoperative ureteral identification.  After reviewing the management options for treatment, they elected to proceed with the above surgical procedure(s). We have discussed the potential benefits and risks of the procedure, side effects of the proposed treatment, the likelihood of the patient achieving the goals of the procedure, and any potential problems that might occur during the procedure or recuperation. Informed consent has been obtained.  Description of procedure:  The patient was taken to the operating room and general anesthesia was induced. SCDs were placed for DVT prophylaxis. The patient was placed in the dorsal lithotomy position, prepped and draped in the usual sterile fashion, and preoperative antibiotics were administered. A preoperative time-out was performed.   A 21 French rigid cystoscope was used to intubate the urethra and thorough cystoscopy was performed.  The bladder was  grossly normal and there were no abnormal lesions or obvious areas of fistula.  A 5 French access catheter was used to intubate the left ureteral orifice, advanced a few centimeters in and 10 mL of ICG was injected into the kidney.  An identical procedure was performed on the right side.  Foley catheter will be placed by general surgery on the field.  Disposition: Stable to PACU  Plan: Continue with general surgery portion of case  Legrand Rams, MD

## 2020-06-21 LAB — CBC
HCT: 34.5 % — ABNORMAL LOW (ref 36.0–46.0)
Hemoglobin: 11.4 g/dL — ABNORMAL LOW (ref 12.0–15.0)
MCH: 28.1 pg (ref 26.0–34.0)
MCHC: 33 g/dL (ref 30.0–36.0)
MCV: 85 fL (ref 80.0–100.0)
Platelets: 267 10*3/uL (ref 150–400)
RBC: 4.06 MIL/uL (ref 3.87–5.11)
RDW: 15.8 % — ABNORMAL HIGH (ref 11.5–15.5)
WBC: 11.4 10*3/uL — ABNORMAL HIGH (ref 4.0–10.5)
nRBC: 0 % (ref 0.0–0.2)

## 2020-06-21 LAB — HIV ANTIBODY (ROUTINE TESTING W REFLEX): HIV Screen 4th Generation wRfx: NONREACTIVE

## 2020-06-21 LAB — BASIC METABOLIC PANEL
Anion gap: 4 — ABNORMAL LOW (ref 5–15)
BUN: 14 mg/dL (ref 6–20)
CO2: 27 mmol/L (ref 22–32)
Calcium: 8.4 mg/dL — ABNORMAL LOW (ref 8.9–10.3)
Chloride: 108 mmol/L (ref 98–111)
Creatinine, Ser: 0.73 mg/dL (ref 0.44–1.00)
GFR, Estimated: >60 mL/min (ref >60)
Glucose, Bld: 98 mg/dL (ref 70–99)
Potassium: 3.7 mmol/L (ref 3.5–5.1)
Sodium: 139 mmol/L (ref 135–145)

## 2020-06-21 LAB — PHOSPHORUS: Phosphorus: 3.2 mg/dL (ref 2.5–4.6)

## 2020-06-21 LAB — MAGNESIUM: Magnesium: 1.7 mg/dL (ref 1.7–2.4)

## 2020-06-21 MED ORDER — LOPERAMIDE HCL 2 MG PO CAPS
4.0000 mg | ORAL_CAPSULE | Freq: Four times a day (QID) | ORAL | Status: DC
  Administered 2020-06-21 – 2020-06-22 (×3): 4 mg via ORAL
  Filled 2020-06-21 (×4): qty 2

## 2020-06-21 NOTE — Progress Notes (Signed)
POD # 1 Doing very well Having multiple loose Bm AVSS Labs ok Taking PO well no N/V Minimal pain  PE NAD Chest: some subq emphysema Abd: soft, incisions c/d/I, no peritonitis. Mild appropriate incisional tenderness  A/p Doing well Mobilize DC foley Soft diet Start imodium May develop LAR syndrome

## 2020-06-21 NOTE — Plan of Care (Signed)
Continuing with plan of care. 

## 2020-06-22 MED ORDER — OXYCODONE-ACETAMINOPHEN 5-325 MG PO TABS: 1.0000 | ORAL_TABLET | ORAL | 0 refills | Status: DC | PRN

## 2020-06-22 MED ORDER — LOPERAMIDE HCL 2 MG PO TABS: 4.0000 mg | ORAL_TABLET | Freq: Four times a day (QID) | ORAL | 0 refills | Status: DC

## 2020-06-22 NOTE — Plan of Care (Signed)
Continuing with plan of care. 

## 2020-06-22 NOTE — Discharge Summary (Signed)
  Patient ID: Teresa Salazar MRN: [id number] DOB/AGE: Nov 24, 1960 60 y.o.  Admit date: 06/20/2020 Discharge date: 06/22/2020   Discharge Diagnoses:  Active Problems:   S/P laparoscopic colectomy   Procedures:robotic assisted low anterior resection  Hospital Course: 60 yo female scheduled for Robotic LAR due to colovesical fistula. Underwent an uneventful laparoscopic LAR.  Patient was kept for two nights. At  The time of discharge the patient was ambulating,  pain was controlled.  Her vital signs were stable and she was afebrile.   physical exam at discharge showed a pt  in no acute distress.  Awake and alert.  Abdomen: Soft incisions healing well without infection or peritonitis.  Extremities well-perfused and no edema.  Condition of the patient the time of discharge was stable. She was having loose BM and imodium was given. She did have some left palpebral sub q air that promptly resolved in 36 hrs after surgery.  Disposition: Discharge disposition: 01-Home or Self Care       Discharge Instructions     Call MD for:  difficulty breathing, headache or visual disturbances   Complete by: As directed    Call MD for:  extreme fatigue   Complete by: As directed    Call MD for:  hives   Complete by: As directed    Call MD for:  persistant dizziness or light-headedness   Complete by: As directed    Call MD for:  persistant nausea and vomiting   Complete by: As directed    Call MD for:  redness, tenderness, or signs of infection (pain, swelling, redness, odor or green/yellow discharge around incision site)   Complete by: As directed    Call MD for:  severe uncontrolled pain   Complete by: As directed    Call MD for:  temperature >100.4   Complete by: As directed    Diet - low sodium heart healthy   Complete by: As directed    Discharge instructions   Complete by: As directed    May shower later today   Increase activity slowly   Complete by: As directed    Lifting  restrictions   Complete by: As directed    20 lbs x 6 wks      Allergies as of 06/22/2020   No Known Allergies      Medication List     STOP taking these medications    bisacodyl 5 MG EC tablet Commonly known as: DULCOLAX   metroNIDAZOLE 500 MG tablet Commonly known as: FLAGYL   neomycin 500 MG tablet Commonly known as: MYCIFRADIN   polyethylene glycol powder 17 GM/SCOOP powder Commonly known as: MiraLax       TAKE these medications    amLODipine 10 MG tablet Commonly known as: NORVASC Take 1 tablet (10 mg total) by mouth daily.   loperamide 2 MG tablet Commonly known as: Imodium A-D Take 2 tablets (4 mg total) by mouth every 6 (six) hours.   oxyCODONE-acetaminophen 5-325 MG tablet Commonly known as: Percocet Take 1-2 tablets by mouth every 4 (four) hours as needed for severe pain.        Follow-up Information     Nohely Whitehorn, [place] F, MD Follow up in 2 week(s).   Specialty: General Surgery Contact information: [address] [place] [address] [phone]                  Sterling Big, MD FACS

## 2020-06-22 NOTE — Plan of Care (Signed)
Discharge teaching completed with patient who is in stable condition. 

## 2020-06-24 LAB — SURGICAL PATHOLOGY

## 2020-07-09 ENCOUNTER — Ambulatory Visit (INDEPENDENT_AMBULATORY_CARE_PROVIDER_SITE_OTHER): Payer: Self-pay | Admitting: Surgery

## 2020-07-09 ENCOUNTER — Other Ambulatory Visit: Payer: Self-pay

## 2020-07-09 ENCOUNTER — Encounter: Payer: Self-pay | Admitting: Surgery

## 2020-07-09 VITALS — BP 169/96 | HR 101 | Temp 98.6°F | Ht 61.0 in | Wt 104.0 lb

## 2020-07-09 DIAGNOSIS — Z09 Encounter for follow-up examination after completed treatment for conditions other than malignant neoplasm: Secondary | ICD-10-CM

## 2020-07-09 NOTE — Progress Notes (Signed)
Teresa Salazar is 3 weeks out after robotic low anterior resection for recurrent diverticulitis and colovesical fistula.  She has done remarkably well.  She is ambulating tolerating diet and having bowel movements.  No fevers no chills.  She does endorse increase in bowel movement frequency which is not unexpected   PE NAD Abd: Soft nontender incisions healing well without evidence of infection or hernias.  A/P doing very well after low anterior resection for recurrent diverticulitis and fistula.  No complications at this time.  Return to clinic in about 3 months.

## 2020-07-09 NOTE — Patient Instructions (Signed)
We will call you in September to schedule a follow up appointment for october 2022.

## 2020-09-17 ENCOUNTER — Other Ambulatory Visit: Payer: Self-pay

## 2020-09-17 ENCOUNTER — Ambulatory Visit: Payer: Self-pay | Admitting: Family Medicine

## 2020-09-17 ENCOUNTER — Encounter: Payer: Self-pay | Admitting: Family Medicine

## 2020-09-17 VITALS — BP 169/88 | HR 79 | Ht 61.0 in | Wt 109.0 lb

## 2020-09-17 DIAGNOSIS — I10 Essential (primary) hypertension: Secondary | ICD-10-CM

## 2020-09-17 MED ORDER — LOSARTAN POTASSIUM 50 MG PO TABS: 50.0000 mg | ORAL_TABLET | Freq: Every day | ORAL | 1 refills | Status: DC

## 2020-09-17 NOTE — Assessment & Plan Note (Signed)
Elevated BP, uncontrolled on monotherapy - Home BP readings none  No known complications  Active smoker   Plan:  1. ADD new rx Losartan 50mg  daily, reviewed ACEi ARB risk and precautions -  Continue Amlodipine 10mg  daily - Check BMET in 4 weeks 2. Encourage improved lifestyle - low sodium diet, regular exercise 3. Start monitor BP outside office, bring readings to next visit, if persistently >140/90 or new symptoms notify office sooner

## 2020-09-17 NOTE — Progress Notes (Signed)
Subjective:    Patient ID: Teresa Salazar, female    DOB: 1960/08/20, 60 y.o.   MRN: [id number]  Teresa Salazar is a 60 y.o. female presenting on 09/17/2020 for Hypertension   HPI  Colo-vesicular Fistula Diverticulitis Followed by [facility]  S/p Robotic Sigmoid Colectomy 06/20/20  Recent CT imaging back to 4/12 and updated on 4/28 with fistula still see below Previous treat Cipro Flagyl in past Now she is going to need bowel resection to remove fistula, next apt 06/09/20 with Dr Everlene Farrier   CHRONIC HTN: Elevated BP at home 150s / 90 s Current Meds - Amlodipine 10mg  daily Admits tobacco abuse. Denies CP, dyspnea, HA, edema, dizziness / lightheadedness   Additionally regarding previous GERD - she was having secondary symptoms to the kidney stone and bowels impacting her appetite and wt loss, now the acid reflux has resolved Off Omeprazole famotidine now     Depression screen [facility] 2/9 09/17/2020 05/16/2020 04/17/2020  Decreased Interest 0 0 1  Down, Depressed, Hopeless 0 0 0  PHQ - 2 Score 0 0 1  Altered sleeping 0 0 -  Tired, decreased energy 0 0 -  Change in appetite 0 0 -  Feeling bad or failure about yourself  0 0 -  Trouble concentrating 0 0 -  Moving slowly or fidgety/restless 0 0 -  Suicidal thoughts 0 0 -  PHQ-9 Score 0 0 -  Difficult doing work/chores Not difficult at all - -    Social History   Tobacco Use   Smoking status: Every Day    Packs/day: 0.25    Years: 30.00    Pack years: 7.50    Types: Cigarettes   Smokeless tobacco: Never  Vaping Use   Vaping Use: Never used  Substance Use Topics   Alcohol use: Never   Drug use: Never    Review of Systems Per HPI unless specifically indicated above     Objective:    BP (!) 169/88   Pulse 79   Ht 5\' 1"  (1.549 m)   Wt 109 lb (49.4 kg)   SpO2 100%   BMI 20.60 kg/m   Wt Readings from Last 3 Encounters:  09/17/20 109 lb (49.4 kg)  07/09/20 104 lb (47.2 kg)   06/20/20 110 lb 14.3 oz (50.3 kg)    Physical Exam Vitals and nursing note reviewed.  Constitutional:      General: She is not in acute distress.    Appearance: She is well-developed. She is not diaphoretic.     Comments: Well-appearing, comfortable, cooperative  HENT:     Head: Normocephalic and atraumatic.  Eyes:     General:        Right eye: No discharge.        Left eye: No discharge.     Conjunctiva/sclera: Conjunctivae normal.  Neck:     Thyroid: No thyromegaly.  Cardiovascular:     Rate and Rhythm: Normal rate and regular rhythm.     Heart sounds: Normal heart sounds. No murmur heard. Pulmonary:     Effort: Pulmonary effort is normal. No respiratory distress.     Breath sounds: Normal breath sounds. No wheezing or rales.  Musculoskeletal:        General: Normal range of motion.     Cervical back: Normal range of motion and neck supple.  Lymphadenopathy:     Cervical: No cervical adenopathy.  Skin:    General: Skin is warm and dry.  Findings: No erythema or rash.  Neurological:     Mental Status: She is alert and oriented to person, place, and time.  Psychiatric:        Behavior: Behavior normal.     Comments: Well groomed, good eye contact, normal speech and thoughts     Results for orders placed or performed during the hospital encounter of 06/20/20  HIV Antibody (routine testing w rflx)  Result Value Ref Range   HIV Screen 4th Generation wRfx Non Reactive Non Reactive  CBC  Result Value Ref Range   WBC 16.5 (H) 4.0 - 10.5 K/uL   RBC 4.39 3.87 - 5.11 MIL/uL   Hemoglobin 12.3 12.0 - 15.0 g/dL   HCT 25.0 53.9 - 76.7 %   MCV 86.8 80.0 - 100.0 fL   MCH 28.0 26.0 - 34.0 pg   MCHC 32.3 30.0 - 36.0 g/dL   RDW 34.1 93.7 - 90.2 %   Platelets 265 150 - 400 K/uL   nRBC 0.0 0.0 - 0.2 %  Creatinine, serum  Result Value Ref Range   Creatinine, Ser 0.63 0.44 - 1.00 mg/dL   GFR, Estimated >40 >97 mL/min  Basic metabolic panel  Result Value Ref Range   Sodium  139 135 - 145 mmol/L   Potassium 3.7 3.5 - 5.1 mmol/L   Chloride 108 98 - 111 mmol/L   CO2 27 22 - 32 mmol/L   Glucose, Bld 98 70 - 99 mg/dL   BUN 14 6 - 20 mg/dL   Creatinine, Ser 3.53 0.44 - 1.00 mg/dL   Calcium 8.4 (L) 8.9 - 10.3 mg/dL   GFR, Estimated >29 >92 mL/min   Anion gap 4 (L) 5 - 15  Magnesium  Result Value Ref Range   Magnesium 1.7 1.7 - 2.4 mg/dL  CBC  Result Value Ref Range   WBC 11.4 (H) 4.0 - 10.5 K/uL   RBC 4.06 3.87 - 5.11 MIL/uL   Hemoglobin 11.4 (L) 12.0 - 15.0 g/dL   HCT 42.6 (L) 83.4 - 19.6 %   MCV 85.0 80.0 - 100.0 fL   MCH 28.1 26.0 - 34.0 pg   MCHC 33.0 30.0 - 36.0 g/dL   RDW 22.2 (H) 97.9 - 89.2 %   Platelets 267 150 - 400 K/uL   nRBC 0.0 0.0 - 0.2 %  Phosphorus  Result Value Ref Range   Phosphorus 3.2 2.5 - 4.6 mg/dL  ABO/Rh  Result Value Ref Range   ABO/RH(D)      B POS Performed at [facility], [address], [place], [place] [address]   Surgical pathology  Result Value Ref Range   SURGICAL PATHOLOGY      SURGICAL PATHOLOGY CASE: [id number] PATIENT: [facility] Surgical Pathology Report     Specimen Submitted: A. Colon, sigmoid  Clinical History: Colovesical fistula      DIAGNOSIS: A. COLON, SIGMOID; RESECTION: - DIVERTICULOSIS WITH FIBROUS SEROSAL ADHESIONS CONSISTENT WITH HISTORY OF RECURRENT DIVERTICULITIS. - THREE REACTIVE LYMPH NODES. - UNREMARKABLE MARGINS OF RESECTION. - NEGATIVE FOR DYSPLASIA AND MALIGNANCY.  GROSS DESCRIPTION: A. Labeled: Sigmoid colon Received: In formalin Collection time: 11:43 AM on 06/20/2020 Placed into formalin time: 11:50 AM on 06/20/2020 Specimens received: One portion of bowel received Measurements: Specimen measures 30.2 cm in length and between 2.8 and 3.4 cm in diameter. Specimen Integrity: Specimen is received stapled at both ends. Orientation: Orientation not provided External surface: A moderate amount of pericolonic fat is identified. Serosa is pink-tan  and dusky, with moderate  congestion.  An area of b owel to bowel adhesion is identified, involving a 4.0 cm length of specimen.  Multiple areas of bulging serosa are identified, measuring up to 2.2 cm in greatest dimension. No evidence of perforations or fistulas is identified. Description: Specimen is opened longitudinally to reveal yellow-tan mucosa with prominent mucosal folds. Multiple diverticula are identified without perforation. Remaining mucosa is unremarkable.  No lesions or polyps are identified. Lymph nodes: 5 candidate lymph nodes are identified measuring up to 0.5 cm in greatest dimension.  Block Summary: 1-2: One margin 3-4: One margin 5-7: Diverticula 8: Bowel to bowel adhesion 9: 5 lymph nodes  BAS 06/23/2020   Final Diagnosis performed by Elijah Birk, MD.   Electronically signed 06/24/2020 4:44:10PM The electronic signature indicates that the named Attending Pathologist has evaluated the specimen Technical component performed at [place], [address], [place], [place] [address] Lab: [phone]  Dir: Jolene Schimke, MD, MMM  Professional component performed at [facility], [facility], [address] [place], [place], [place] [address] Lab: [phone] Dir: Georgiann Cocker. Rubinas, MD       Assessment & Plan:   Problem List Items Addressed This Visit     Essential hypertension - Primary    Elevated BP, uncontrolled on monotherapy - Home BP readings none  No known complications  Active smoker   Plan:  1. ADD new rx Losartan 50mg  daily, reviewed ACEi ARB risk and precautions -  Continue Amlodipine 10mg  daily - Check BMET in 4 weeks 2. Encourage improved lifestyle - low sodium diet, regular exercise 3. Start monitor BP outside office, bring readings to next visit, if persistently >140/90 or new symptoms notify office sooner      Relevant Medications   losartan (COZAAR) 50 MG tablet   Other Relevant Orders   BASIC METABOLIC PANEL WITH GFR     Orders Placed This Encounter  Procedures   BASIC METABOLIC PANEL WITH GFR    Standing Status:   Future    Standing Expiration Date:   01/03/2021     Meds ordered this encounter  Medications   losartan (COZAAR) 50 MG tablet    Sig: Take 1 tablet (50 mg total) by mouth daily.    Dispense:  90 tablet    Refill:  1      Follow up plan: Return in about 4 weeks (around 10/15/2020) for 4 weeks non fasting lab in PM, then Telephone Visit for HTN updates.    01/05/2021, DO [facility] 09/17/2020, 8:57 AM

## 2020-09-17 NOTE — Patient Instructions (Addendum)
Thank you for coming to the office today.  Start Losartan 50mg  daily, take in evening with other BP med.  Caution if facial or lip swelling - If acute worsening swelling involving face or throat, difficulty breathing or other systemic symptoms, you should go directly to the Emergency Department, if just recurrent episode but persistent you can notify for trial on Prednisone.   Please schedule a Follow-up Appointment to: Return in about 4 weeks (around 10/15/2020) for 4 weeks non fasting lab in PM, then Telephone Visit for HTN updates.  If you have any other questions or concerns, please feel free to call the office or send a message through MyChart. You may also schedule an earlier appointment if necessary.  Additionally, you may be receiving a survey about your experience at our office within a few days to 1 week by e-mail or mail. We value your feedback.  12/15/2020, DO [facility], [facility]

## 2020-10-14 ENCOUNTER — Other Ambulatory Visit: Payer: Self-pay

## 2020-10-14 DIAGNOSIS — I10 Essential (primary) hypertension: Secondary | ICD-10-CM

## 2020-10-15 ENCOUNTER — Other Ambulatory Visit: Payer: Self-pay

## 2020-10-17 LAB — BASIC METABOLIC PANEL WITH GFR
BUN: 20 mg/dL (ref 7–25)
CO2: 28 mmol/L (ref 20–32)
Calcium: 9.7 mg/dL (ref 8.6–10.4)
Chloride: 103 mmol/L (ref 98–110)
Creat: 0.94 mg/dL (ref 0.50–1.05)
Glucose, Bld: 109 mg/dL (ref 65–139)
Potassium: 4 mmol/L (ref 3.5–5.3)
Sodium: 139 mmol/L (ref 135–146)
eGFR: 69 mL/min/{1.73_m2} (ref >=60)

## 2020-10-22 ENCOUNTER — Ambulatory Visit: Payer: Self-pay | Admitting: Family Medicine

## 2020-10-22 ENCOUNTER — Encounter: Payer: Self-pay | Admitting: Family Medicine

## 2020-10-22 ENCOUNTER — Other Ambulatory Visit: Payer: Self-pay

## 2020-10-22 DIAGNOSIS — I10 Essential (primary) hypertension: Secondary | ICD-10-CM

## 2020-10-22 NOTE — Patient Instructions (Addendum)
Thank you for coming to the office today.  Increase Losartan from 4m up to ONE AND HALF pills = 758mdaily. If this works - keep track of BP, and then notify our office to INCREASE your dose to 7584mone and half pills) If it is not strong enough, if BP >140/90 - then we should trial 2 pills = 100m65mily, I can order the higher dose (1 pill)  ---------------------  Ask Dr PaboDahlia Byesut any Colonoscopy or colon cancer screening - we can offer the [facility] refer you to GI specialist for Colonoscopy. Higher cost if self pay.  ------------------------------------------------------  You may be eligible to participate in some free health screening programs. ------------------------------------- Consider [facility] in [place] for free lab screening  httphttps://[email or web address]/---------------------------------------    [email or web address]://www.patel-king.com/lso offers genetic risk assessment   Breast and Cervical Cancer Control Program (BCCCP): Women who are uninsured or underinsured may be eligible to receive a free mammogram and pap smear through the Breast and Cervical Cancer Control Program (BCCCP). For more information about eligibility for this program, call [phone]----------------------------------    For Mammogram screening for breast cancer    Call the [facility] anytime to schedule your own appointment now that order has been placed.  [facility] [id number]: [phone]   NEXT year in 2023 will need pap smear  [facility] [id number] [place] [id number]: 8am Nena [place]: [address]: [address] 2[place] [facility] [employer] [id number]:  8AM-5PM Phone: [phone] schedule a Follow-up Appointment to: Return in about 6 months (around 04/22/2021) for 6 month follow-up HTN.  If you have any other questions or concerns, please feel free to call the office or send a message through [place] may also schedule an earlier appointment if necessary.  Additionally, you may be receiving a survey about your experience at our office within a few days to 1 week by e-mail or mail. We value your feedback.  [place]ie [place]

## 2020-10-22 NOTE — Assessment & Plan Note (Signed)
Elevated BP still, overall improved after added ARB Chemistry normal Cr / K on med - Home BP readings detailed readings today but still elevated lately 140-160 No known complications  Active smoker   Plan:  1. Dose increase Losartan from 50 to 75mg  (one and half tab) for now, monitor BP closely may adjust to 100mg  daily, drop off BP log or come by with update - we can order 1.5 pills for 75 in future or 100mg  if she requests -  Continue Amlodipine 10mg  daily 2. Encourage improved lifestyle - low sodium diet, regular exercise 3. Continue monitor BP outside office, bring readings to next visit, if persistently >140/90 or new symptoms notify office sooner

## 2020-10-22 NOTE — Progress Notes (Signed)
Subjective:    Patient ID: Teresa Salazar, female    DOB: March 01, 1960, 60 y.o.   MRN: [id number]  Teresa Salazar is a 60 y.o. female presenting on 10/22/2020 for Hypertension   HPI  CHRONIC HTN: Elevated BP at home 160s / 90 s previously, last visit 09/2020 she was started on Losartan 50mg  daily added. Has tolerated well and feels better, but still home readings show mostly 140-150 / 80, and lately some 160s Current Meds - Amlodipine 10mg  daily, Losartan 50mg  daily Admits tobacco abuse, smoking daily 1 cup coffee daily Denies CP, dyspnea, HA, edema, dizziness / lightheadedness  No further GERD  F/u Dr  Health Maintenance:  Has not had Colonoscopy, has had colovesicular fistula   Depression screen [facility] 2/9 09/17/2020 05/16/2020 04/17/2020  Decreased Interest 0 0 1  Down, Depressed, Hopeless 0 0 0  PHQ - 2 Score 0 0 1  Altered sleeping 0 0 -  Tired, decreased energy 0 0 -  Change in appetite 0 0 -  Feeling bad or failure about yourself  0 0 -  Trouble concentrating 0 0 -  Moving slowly or fidgety/restless 0 0 -  Suicidal thoughts 0 0 -  PHQ-9 Score 0 0 -  Difficult doing work/chores Not difficult at all - -    Social History   Tobacco Use   Smoking status: Every Day    Packs/day: 0.25    Years: 30.00    Pack years: 7.50    Types: Cigarettes   Smokeless tobacco: Never  Vaping Use   Vaping Use: Never used  Substance Use Topics   Alcohol use: Never   Drug use: Never    Review of Systems Per HPI unless specifically indicated above     Objective:    BP (!) 148/70 (BP Location: Left Arm, Cuff Size: Normal)   Pulse 86   Ht 5\' 1"  (1.549 m)   Wt 116 lb 3.2 oz (52.7 kg)   SpO2 100%   BMI 21.96 kg/m   Wt Readings from Last 3 Encounters:  10/22/20 116 lb 3.2 oz (52.7 kg)  09/17/20 109 lb (49.4 kg)  07/09/20 104 lb (47.2 kg)    Physical Exam Vitals and nursing note reviewed.  Constitutional:      General: She is not in acute distress.     Appearance: She is well-developed. She is not diaphoretic.     Comments: Well-appearing, comfortable, cooperative  HENT:     Head: Normocephalic and atraumatic.  Eyes:     General:        Right eye: No discharge.        Left eye: No discharge.     Conjunctiva/sclera: Conjunctivae normal.  Neck:     Thyroid: No thyromegaly.  Cardiovascular:     Rate and Rhythm: Normal rate and regular rhythm.     Heart sounds: Normal heart sounds. No murmur heard. Pulmonary:     Effort: Pulmonary effort is normal. No respiratory distress.     Breath sounds: Normal breath sounds. No wheezing or rales.  Musculoskeletal:        General: Normal range of motion.     Cervical back: Normal range of motion and neck supple.  Lymphadenopathy:     Cervical: No cervical adenopathy.  Skin:    General: Skin is warm and dry.     Findings: No erythema or rash.  Neurological:     Mental Status: She is alert and oriented to person, place, and  time.  Psychiatric:        Behavior: Behavior normal.     Comments: Well groomed, good eye contact, normal speech and thoughts    Pap Smear Specimen:  AP Specimen - Cervix uteri structure (body structure) Component 4 yr ago  Case Report  Gynecologic Cytology Report                       Case: [id number]                                Authorizing Provider:  Carmine Savoy,  Collected:           01/28/2016 1047                                     MD                                                                          Ordering Location:     [facility]      Received:            01/29/2016 1006                                     [facility] [place]                                                                             [place]                                                                        First Screen:          Kirstie Peri                                                              Specimen:    Liquid-Based Pap Smear, Cervix                                                          Interpretation  Negative for intraepithelial lesion or malignancy  Electronically signed by Kirstie Peri on 02/09/2016 at  2:56 PM  Specimen Adequacy  Satisfactory for evaluation, endocervical/transformation zone component present   LMP  >32yr   HPV Results      HPV Result: Negative  HPV Resulted on: 01/30/2016 1608 EST  Specimen Number: [id number]     PAP EDUCATIONAL NOTE    The PAP smear is a screening test used as an aid in detecting cervical cancer and its precursors. Published data indicates that PAP smear testing is subject to both false negative and false positive results. It is NOT a diagnostic procedure and should not be used as the sole means of detecting cervical cancer. Periodic repeat testing and follow-up of any unexplained clinical signs and symptoms are recommended.   [facility] Healthcare processes liquid-based PAP specimens with the Cytyc T2000 and T5000 Processing system. After processing, ThinPrep  PAP specimens are analyzed by the Cytyc ThinPrep  Imaging System, an automated imaging and review system which assists the lab in the evaluation of cells in the ThinPrep  PAP test. Following automated imaging, selected fields from every slide are reviewed by a cytotechnologist, and a pathologist if necessary.    Resulting Agency [facility]  Specimen Collected: 01/28/16 10:47 Last Resulted: 02/09/16 14:56  Received From: [facility]  Result Received: 04/08/20 14:37    Results for orders placed or performed in visit on 10/22/20  HM PAP SMEAR  Result Value Ref Range   HM Pap smear      negative for intraepithelial lesion or malignancy and no high risk HPV      Assessment & Plan:   Problem List Items Addressed This Visit     Essential hypertension    Elevated BP still, overall improved after added ARB Chemistry normal Cr / K on med - Home BP readings detailed readings today but still elevated  lately 140-160 No known complications  Active smoker   Plan:  1. Dose increase Losartan from 50 to 75mg  (one and half tab) for now, monitor BP closely may adjust to 100mg  daily, drop off BP log or come by with update - we can order 1.5 pills for 75 in future or 100mg  if she requests -  Continue Amlodipine 10mg  daily 2. Encourage improved lifestyle - low sodium diet, regular exercise 3. Continue monitor BP outside office, bring readings to next visit, if persistently >140/90 or new symptoms notify office sooner      Relevant Medications   losartan (COZAAR) 50 MG tablet    No orders of the defined types were placed in this encounter.     Follow up plan: Return in about 6 months (around 04/22/2021) for 6 month follow-up HTN.   , DO [address] [facility] 10/22/2020, 4:18 PM

## 2020-10-27 ENCOUNTER — Encounter: Payer: Self-pay | Admitting: Surgery

## 2020-10-27 ENCOUNTER — Ambulatory Visit (INDEPENDENT_AMBULATORY_CARE_PROVIDER_SITE_OTHER): Payer: Self-pay | Admitting: Surgery

## 2020-10-27 ENCOUNTER — Other Ambulatory Visit: Payer: Self-pay

## 2020-10-27 VITALS — BP 182/98 | HR 85 | Temp 98.9°F | Ht 61.0 in | Wt 116.2 lb

## 2020-10-27 DIAGNOSIS — Z09 Encounter for follow-up examination after completed treatment for conditions other than malignant neoplasm: Secondary | ICD-10-CM

## 2020-10-27 DIAGNOSIS — Z9049 Acquired absence of other specified parts of digestive tract: Secondary | ICD-10-CM

## 2020-10-27 NOTE — Patient Instructions (Signed)
If you have any concerns or questions, please feel free to call our office. Follow up as needed.  

## 2020-10-29 NOTE — Progress Notes (Signed)
Outpatient Surgical Follow Up  10/29/2020  LERAE LANGHAM is an 60 y.o. female.   Chief Complaint  Patient presents with   Routine Post Op    Sigmoid colectomy 06/2020    HPI: Fabiha is a 60 year old female with a history of colovesical fistula requiring low anterior resection robotically.  She is 4 months out and is doing very well.  No fevers no chills no abdominal pain.  There is no drainage.  She has no pneumaturia and her urinary symptoms have completely resolved.  She is very Adult nurse.  Past Medical History:  Diagnosis Date   Colovesical fistula 2022   Diverticulitis    Elevated blood pressure, situational    h/o -has not been on bp meds in years due to bp control   GERD (gastroesophageal reflux disease)    History of kidney stones    Kidney stone     Past Surgical History:  Procedure Laterality Date   CESAREAN SECTION  1985   CYSTOSCOPY/URETEROSCOPY/HOLMIUM LASER/STENT PLACEMENT Left 05/02/2020   Procedure: CYSTOSCOPY/URETEROSCOPY/HOLMIUM LASER/STENT PLACEMENT;  Surgeon: Sondra Come, MD;  Location: [facility] ORS;  Service: Urology;  Laterality: Left;    Family History  Problem Relation Age of Onset   Diabetes Mother    Diabetes Father     Social History:  reports that she has been smoking cigarettes. She has a 7.50 pack-year smoking history. She has never used smokeless tobacco. She reports that she does not drink alcohol and does not use drugs.  Allergies: No Known Allergies  Medications reviewed.    ROS Full ROS performed and is otherwise negative other than what is stated in HPI   BP (!) 182/98   Pulse 85   Temp 98.9 F (37.2 C) (Oral)   Ht 5\' 1"  (1.549 m)   Wt 116 lb 3.2 oz (52.7 kg)   SpO2 99%   BMI 21.96 kg/m   Physical Exam  NAD, alert Abd: soft, nt, scars in place, no infection, no hernias Ext: well perfused w/o edema  Assessment/Plan:  60 year old following 4 months after low anterior resection for colovesical fistula.  No  complications.  She is doing very well without any gastrointestinal complaints.  We will follow her up on a as needed basis.  She is very appreciative   Greater than 50% of the 20 minutes  visit was spent in counseling/coordination of care   67, MD [facility] General Surgeon

## 2020-11-16 ENCOUNTER — Other Ambulatory Visit: Payer: Self-pay | Admitting: Family Medicine

## 2020-11-16 DIAGNOSIS — I10 Essential (primary) hypertension: Secondary | ICD-10-CM

## 2020-11-16 NOTE — Telephone Encounter (Signed)
Requested Prescriptions  Pending Prescriptions Disp Refills  . amLODipine (NORVASC) 10 MG tablet [Pharmacy Med Name: AMLODIPINE BESYLATE 10 MG TAB] 90 tablet 1    Sig: TAKE 1 TABLET BY MOUTH EVERY DAY     Cardiovascular:  Calcium Channel Blockers Failed - 11/16/2020 12:48 AM      Failed - Last BP in normal range    BP Readings from Last 1 Encounters:  10/27/20 (!) 182/98         Passed - Valid encounter within last 6 months    Recent Outpatient Visits          3 weeks ago Essential hypertension   [facility] [place], Netta Neat, DO   2 months ago Essential hypertension   [facility] Smitty Cords, DO   6 months ago Essential hypertension   [facility] [place], Netta Neat, DO   7 months ago Gastroesophageal reflux disease without esophagitis   [facility] [place], Netta Neat, [place]

## 2021-07-16 IMAGING — CR DG ABDOMEN 1V
1 series · 2 of 2 positions shown · non-contrast
Comparison: CT abdomen and pelvis April 15, 2020

CLINICAL DATA: Nephrolithiasis

EXAM:
ABDOMEN - 1 VIEW

[Series 1: dg abd 1 view · 0.14mm/px · 2 of 2 slices shown]
[im 1/2]
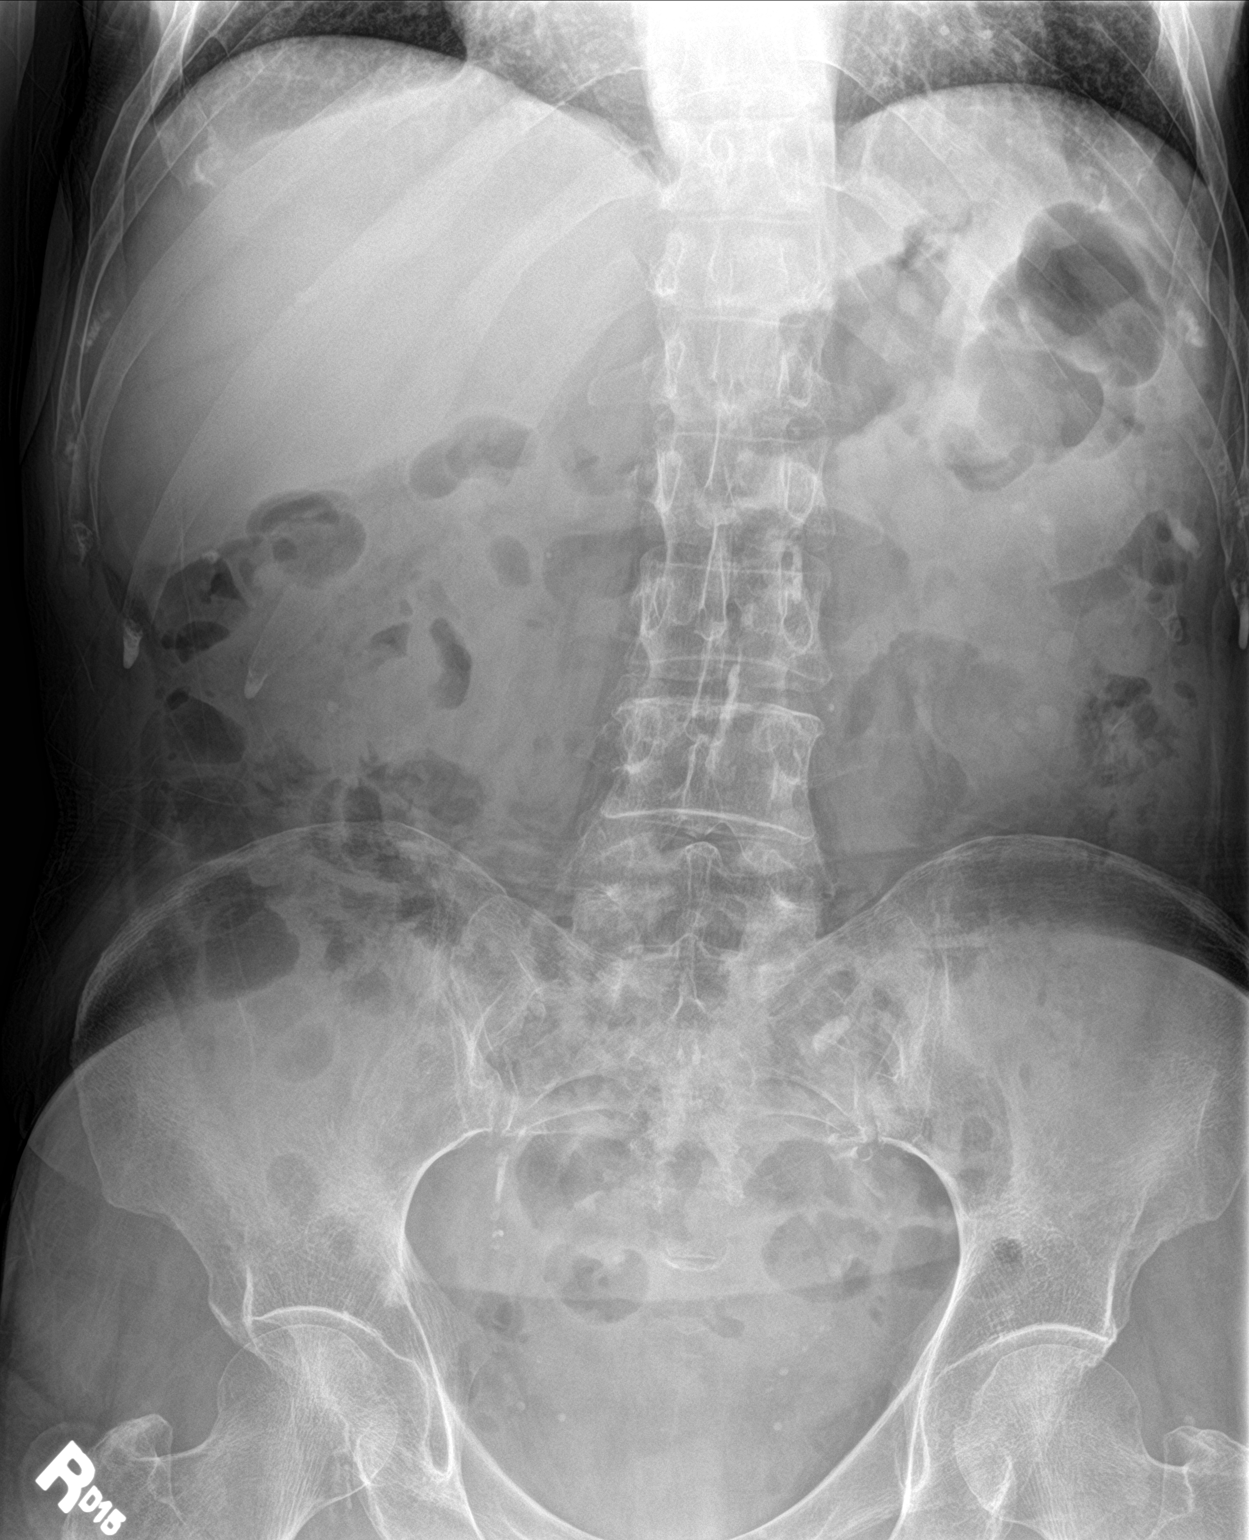
[im 2/2]
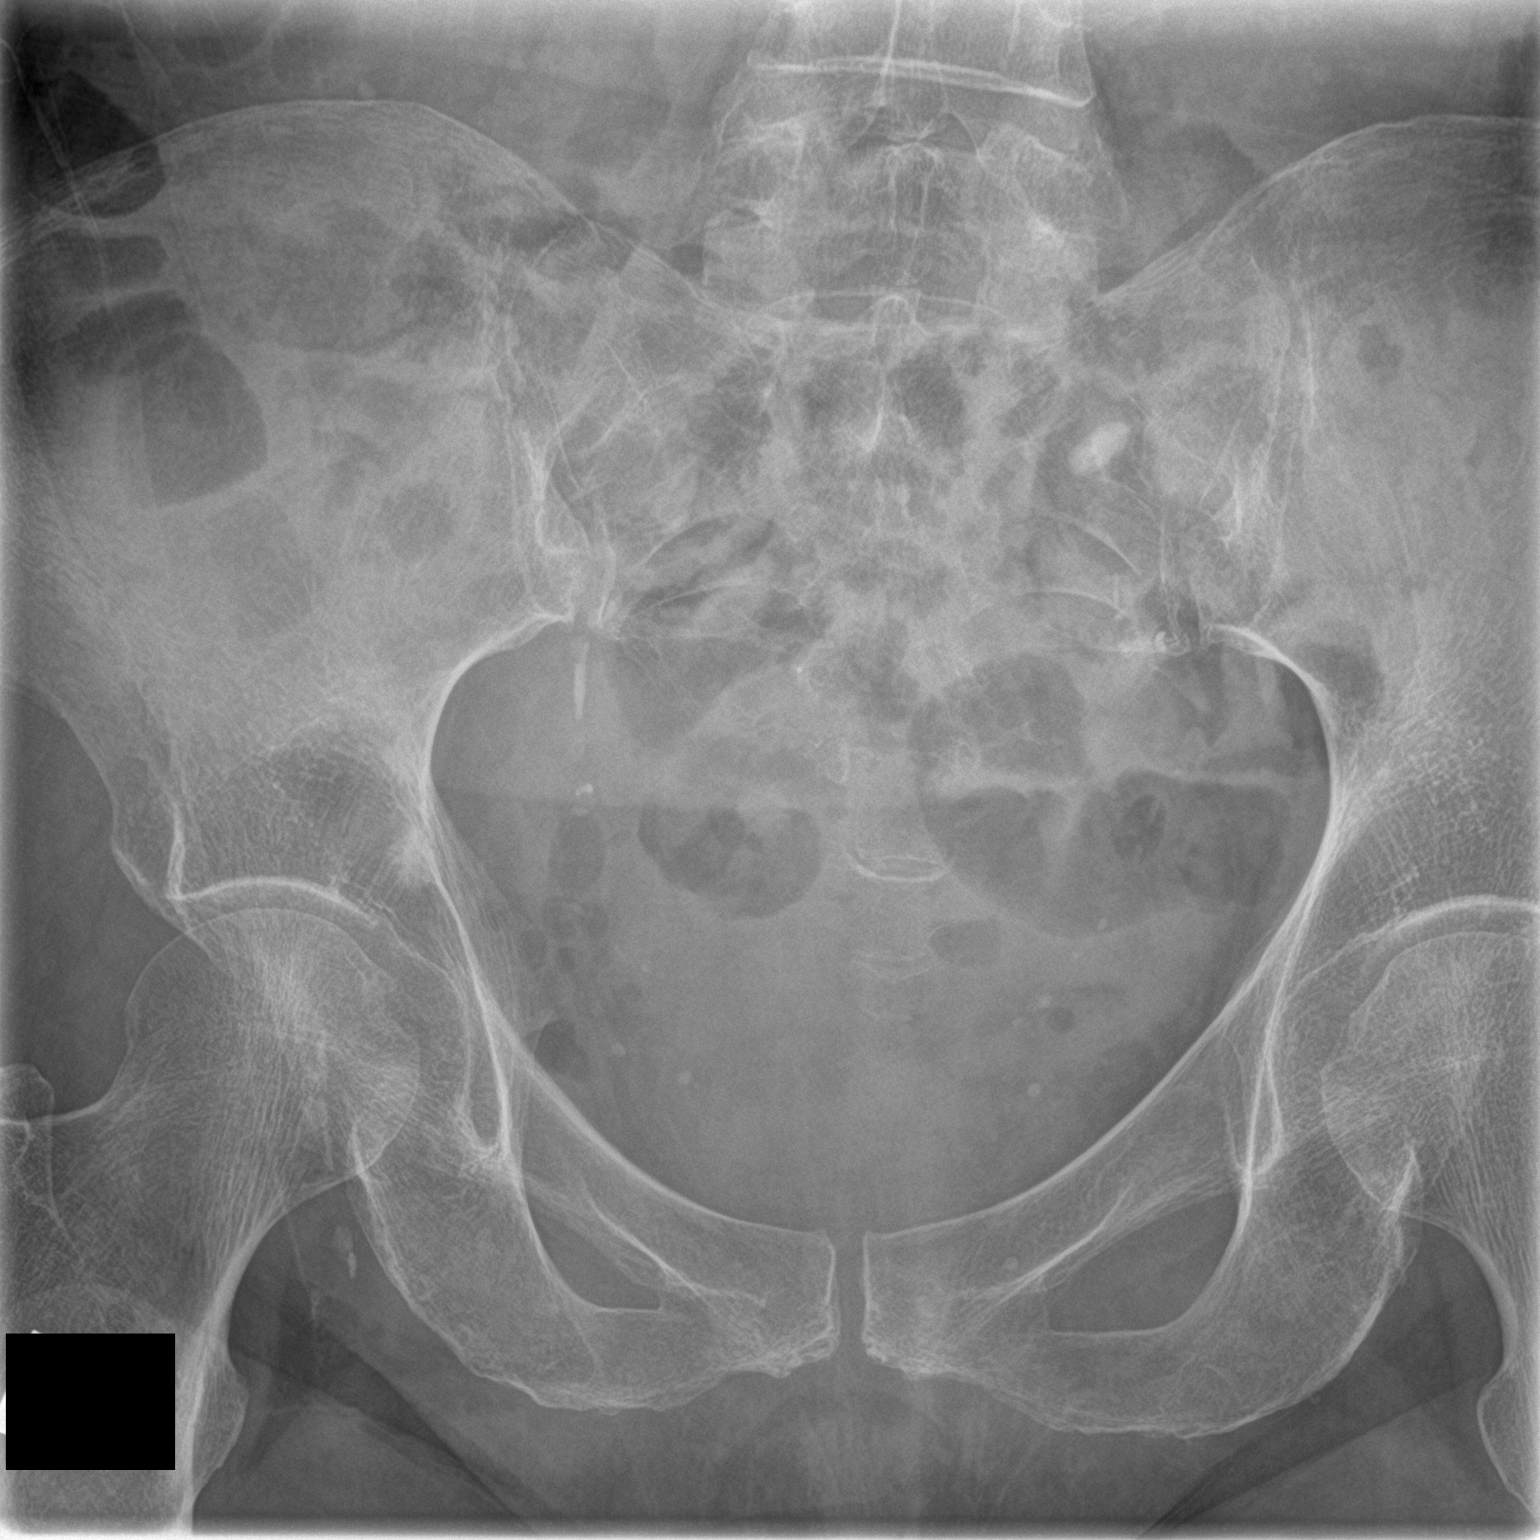

[2 of 2 positions shown; findings below may reference images not displayed]

FINDINGS: A calcification overlying the mid left sacrum measuring 1.2 x 0.5 cm
is concerning for distal left ureteral calculus. There is a 2 mm
calcification in the lower right pelvis. There are apparent
phleboliths in the pelvis.

There is moderate stool in the colon. No bowel dilatation or
air-fluid level to suggest bowel obstruction. No free air. Lung
bases are clear.
IMPRESSION: 1.2 x 0.5 cm suspected ureteral calculus at the mid sacral level. 2
mm calculus lower pole right kidney. Apparent phleboliths in the
pelvis.

No bowel obstruction or free air evident.

## 2021-07-17 IMAGING — CT CT ABD-PELV W/ CM
2 of 5 series · 15 of 46 positions shown, 17 images · IV contrast (omnipaque)
Comparison: 04/15/2020

CLINICAL DATA: Urinary tract calculi, lower abdominal pain, known
colonic fistula

EXAM:
CT ABDOMEN AND PELVIS WITH CONTRAST
TECHNIQUE: Multidetector CT imaging of the abdomen and pelvis was performed
using the standard protocol following bolus administration of
intravenous contrast.
CONTRAST:  75mL OMNIPAQUE IOHEXOL 300 MG/ML  SOLN

[Series 2: abd pelvis 5.00 · axial · 0.60mm/px · z∈[-1447,-1137]mm · 12 of 74 slices shown, 14 images]
[im 6/74  soft-tissue]
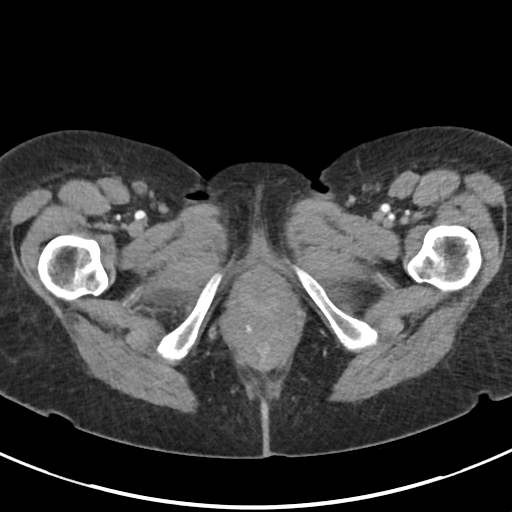
[im 6/74  bone]
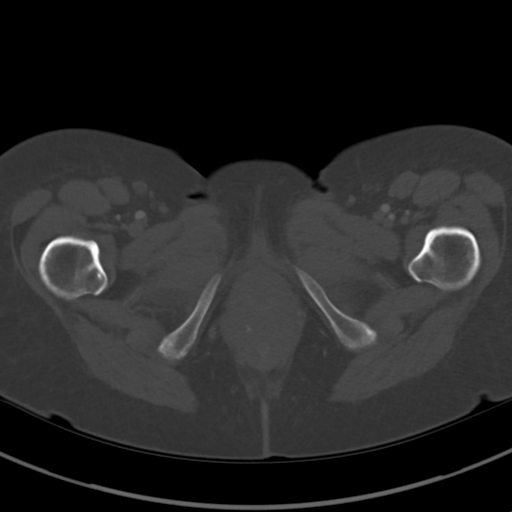
[im 11/74  soft-tissue]
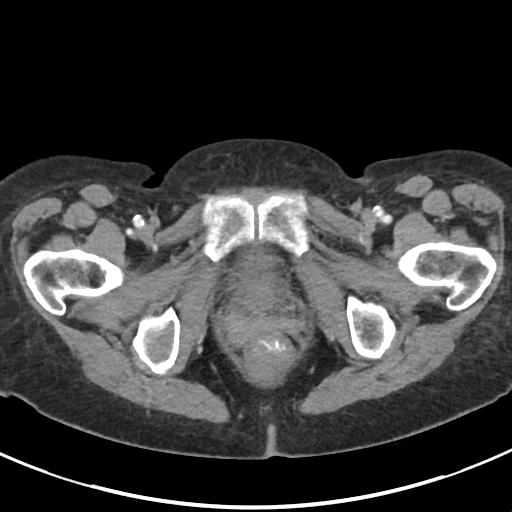
[im 16/74  soft-tissue]
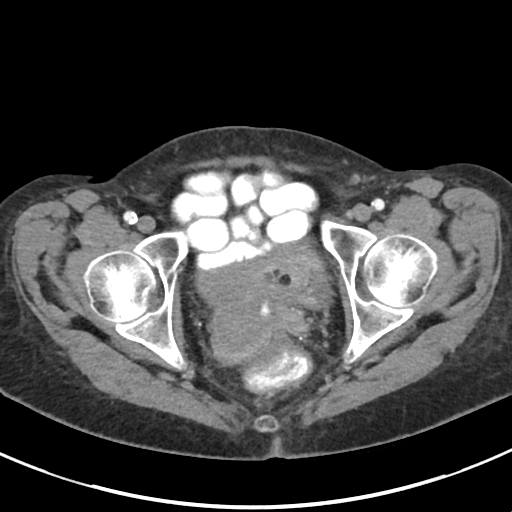
[im 21/74  soft-tissue]
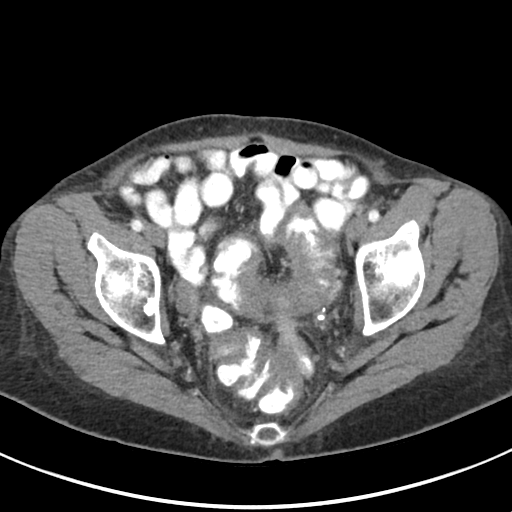
[im 27/74  soft-tissue]
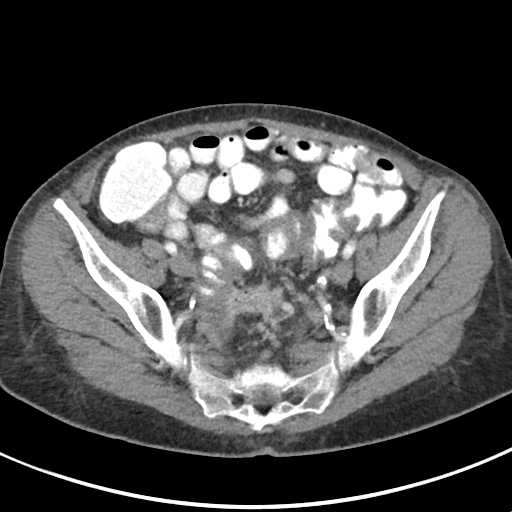
[im 32/74  soft-tissue]
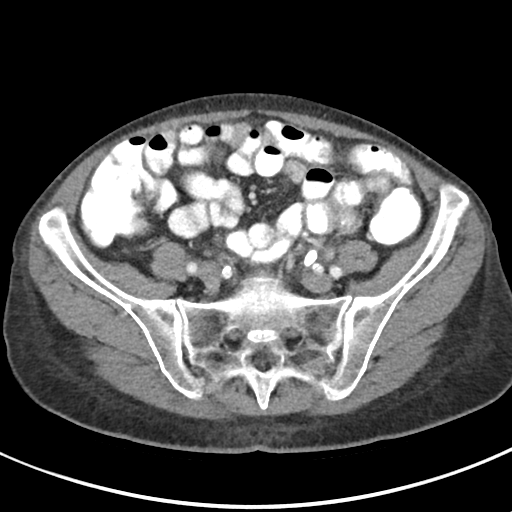
[im 42/74  soft-tissue]
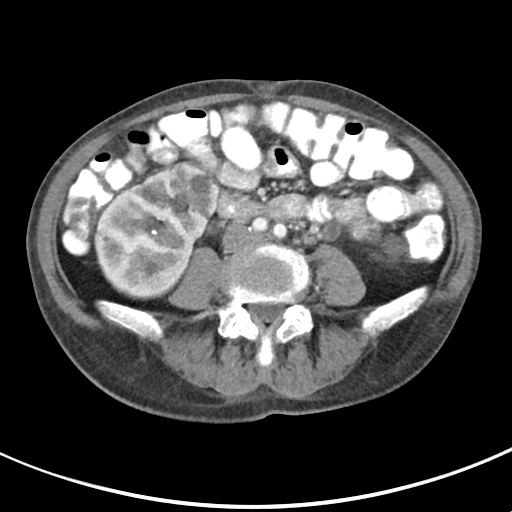
[im 47/74  soft-tissue]
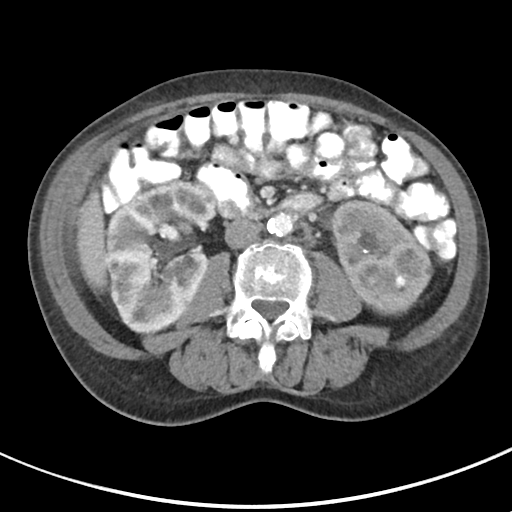
[im 53/74  soft-tissue]
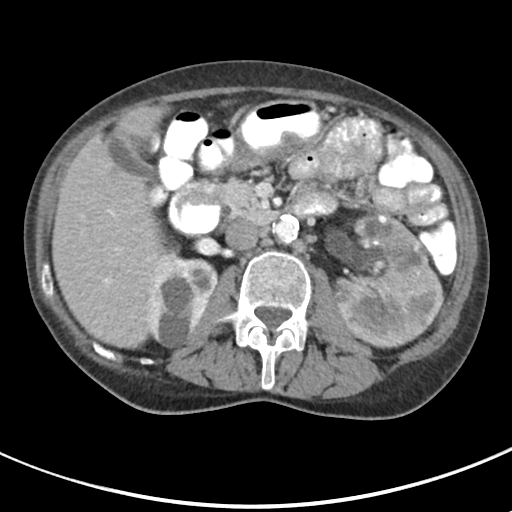
[im 53/74  bone]
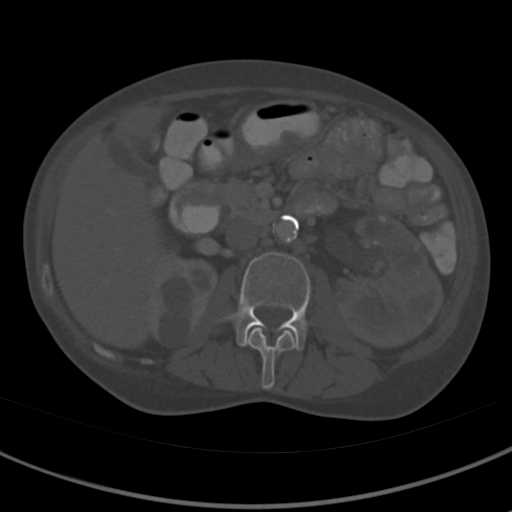
[im 58/74  soft-tissue]
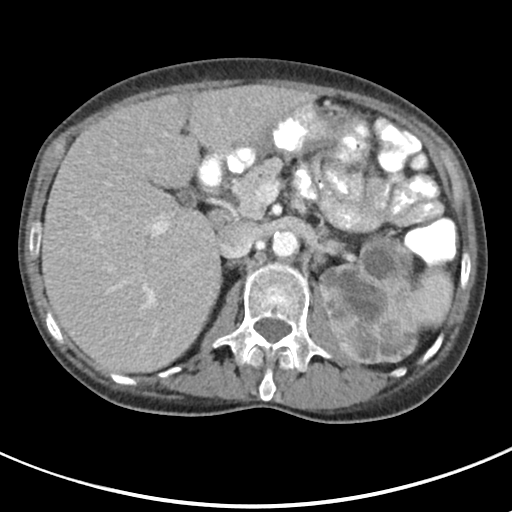
[im 63/74  soft-tissue]
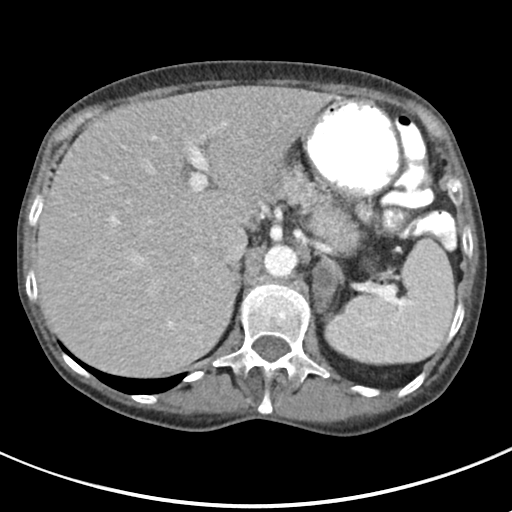
[im 68/74  soft-tissue]
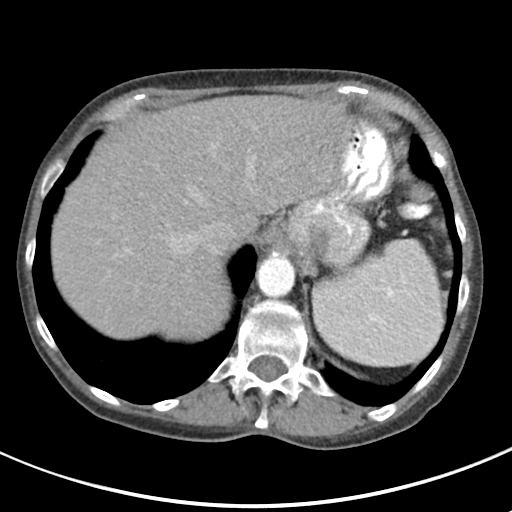

[Series 4: coronals abd pelvis 2.00 cor · coronal · 0.60mm/px · 3 of 129 slices shown]
[im 43/129  soft-tissue]
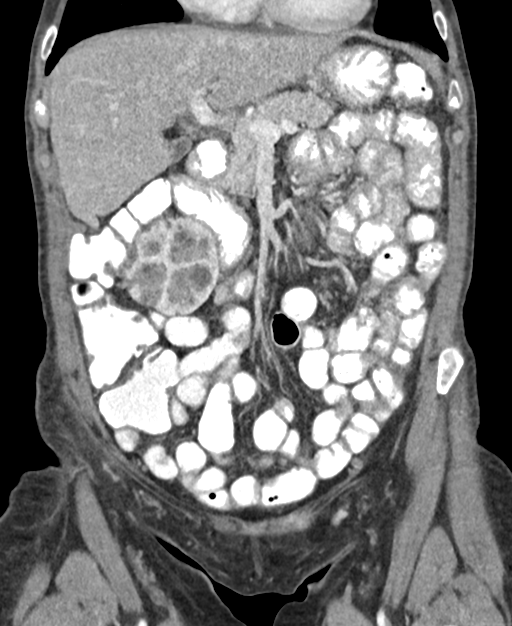
[im 57/129  soft-tissue]
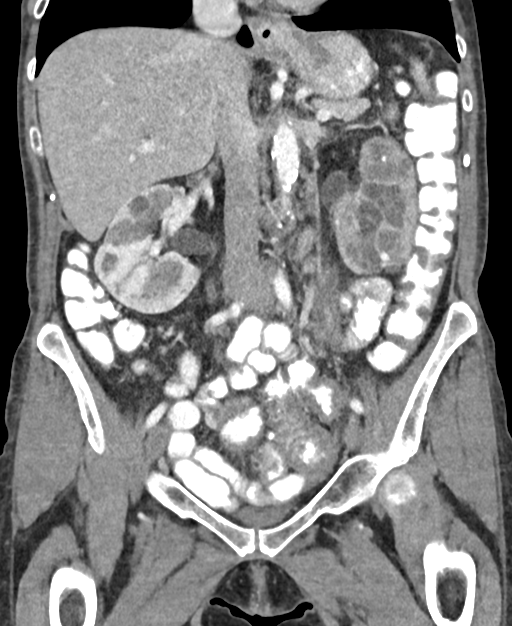
[im 72/129  soft-tissue]
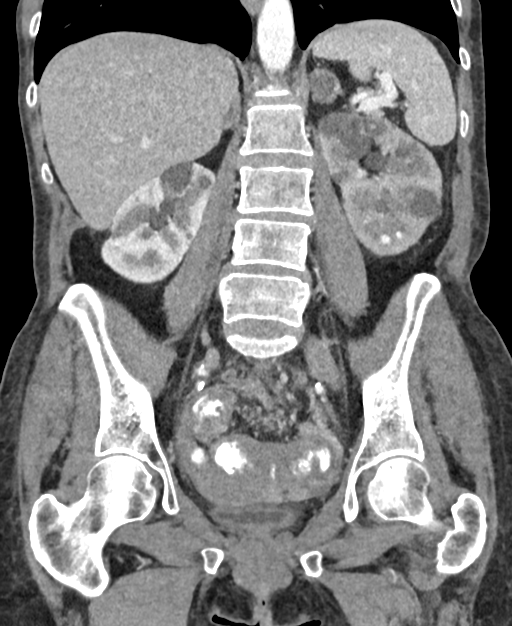

[15 of 46 positions shown; findings below may reference images not displayed]

FINDINGS: Lower chest: No acute pleural or parenchymal lung disease.

Hepatobiliary: No focal liver abnormality is seen. No gallstones,
gallbladder wall thickening, or biliary dilatation.

Pancreas: Unremarkable. No pancreatic ductal dilatation or
surrounding inflammatory changes.

Spleen: Normal in size without focal abnormality.

Adrenals/Urinary Tract: There is high-grade left-sided obstructive
uropathy with delayed left nephrogram and no significant excretion
of contrast on delayed imaging. The left renal calculus seen on
prior CT has migrated into the mid left ureter, measuring 11 mm
reference image 43/2. Stable position of the 4 mm distal left
ureteral calculus reference image 54/2.

There is a stable 2 mm nonobstructing right renal calculus.

Numerous bilateral renal cortical cysts are noted. Bladder is
decompressed, limiting its evaluation.

Stable bilateral adrenal adenomas.

Stomach/Bowel: No bowel obstruction or ileus. Chronic wall
thickening of the sigmoid colon likely reflects previous bouts of
diverticulitis and scarring. The colorectal fistula seen on previous
study is again identified, with 1.5 x 2.7 cm fluid collection in the
rectosigmoid mesentery unchanged.

Vascular/Lymphatic: Stable atherosclerosis. No pathologic adenopathy
within the abdomen or pelvis.

Reproductive: Status post hysterectomy. No adnexal masses.

Other: No free fluid or free intraperitoneal gas. Small fat
containing umbilical hernia. No bowel herniation.

Musculoskeletal: No acute or destructive bony lesions. Reconstructed
images demonstrate no additional findings.
IMPRESSION: 1. High-grade left-sided obstructive uropathy has developed in the
interim, with distal migration of the 11 mm left renal calculus into
the mid left ureter as above. Stable position of the 4 mm distal
left ureteral calculus.
2. Multiple other stable small nonobstructing renal calculi.
3. Stable scarring of the rectosigmoid colon, with evidence of
colorectal fistula and mesenteric fluid collection unchanged since
previous exam.
4.  Aortic Atherosclerosis ([id number]).

## 2021-09-02 IMAGING — US US RENAL
1 series · 14 of 25 positions shown · non-contrast
Comparison: 05/01/2020

CLINICAL DATA: Renal calculi

EXAM:
RENAL / URINARY TRACT ULTRASOUND COMPLETE

[Series 1: us renal · 14 of 48 slices shown]
[im 1/48]
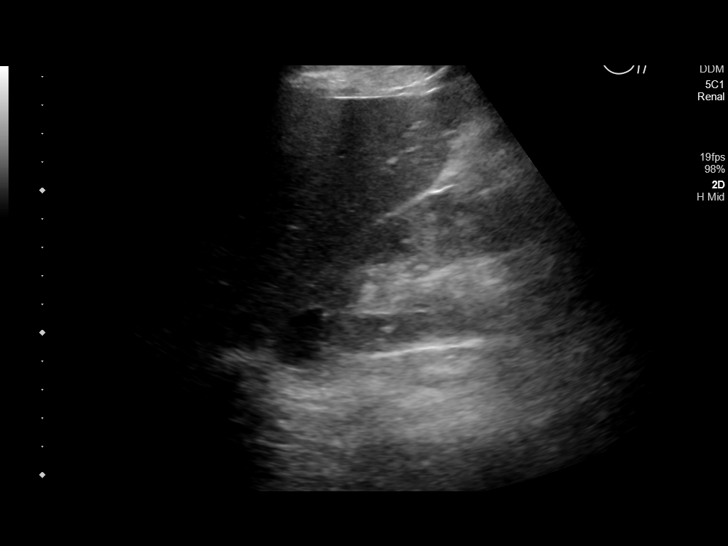
[im 4/48]
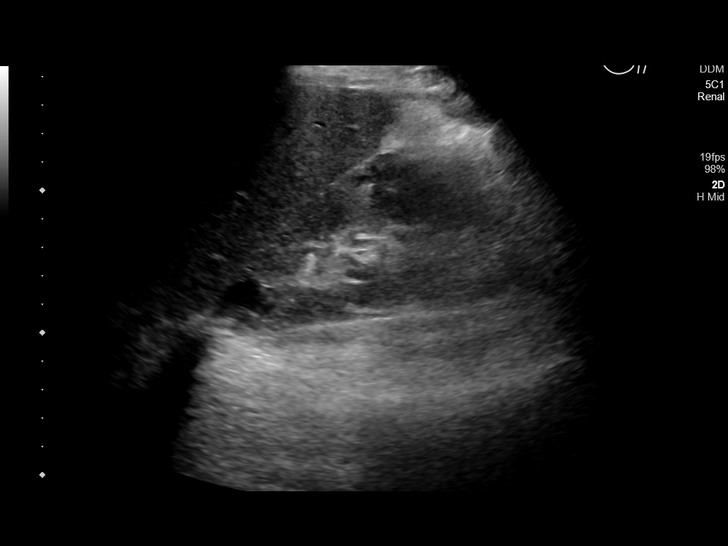
[im 8/48]
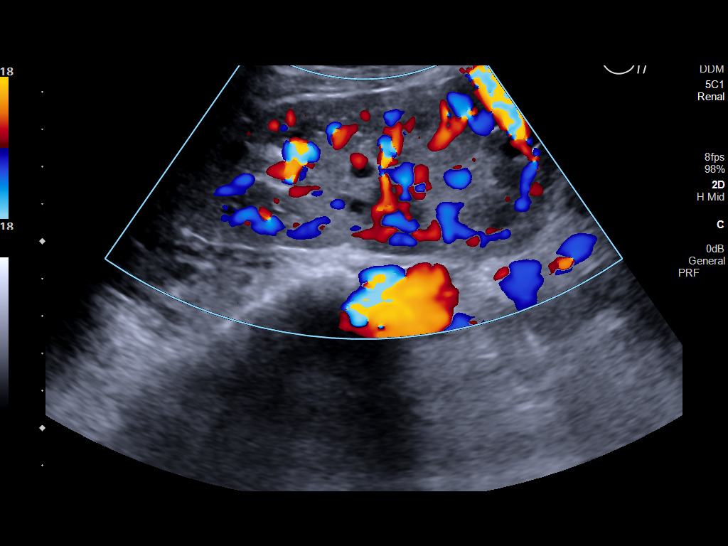
[im 12/48]
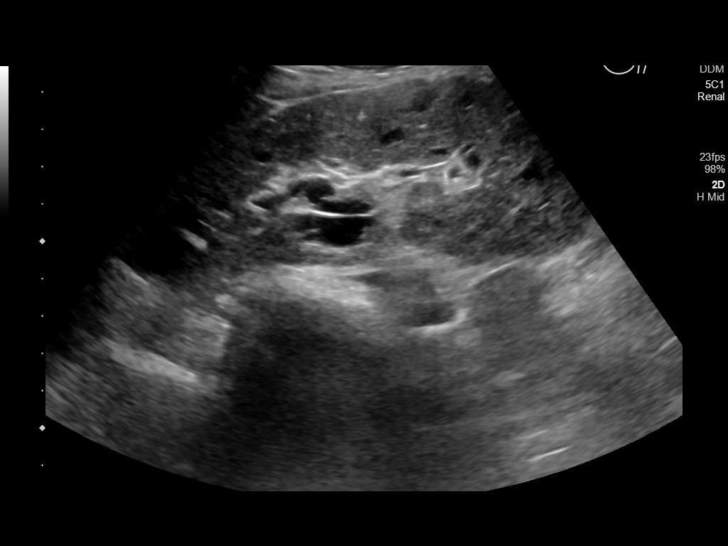
[im 16/48]
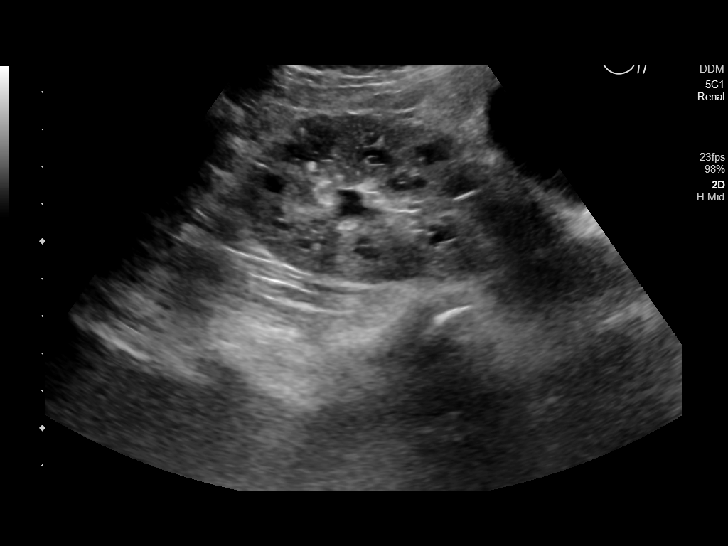
[im 18/48]
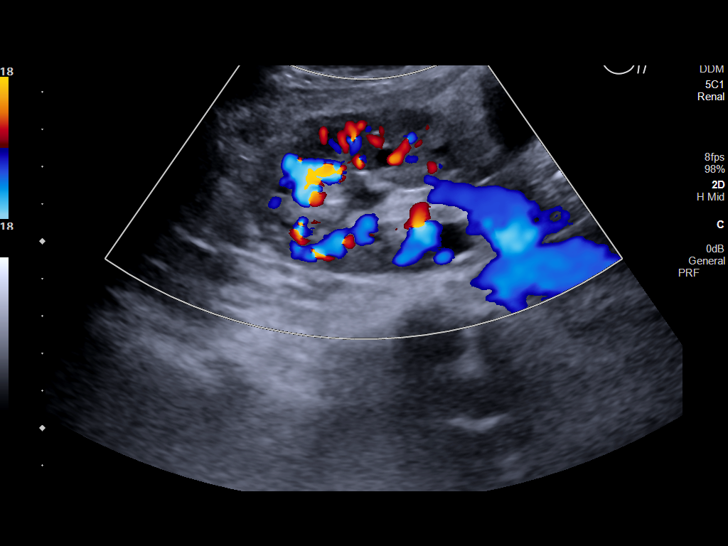
[im 22/48]
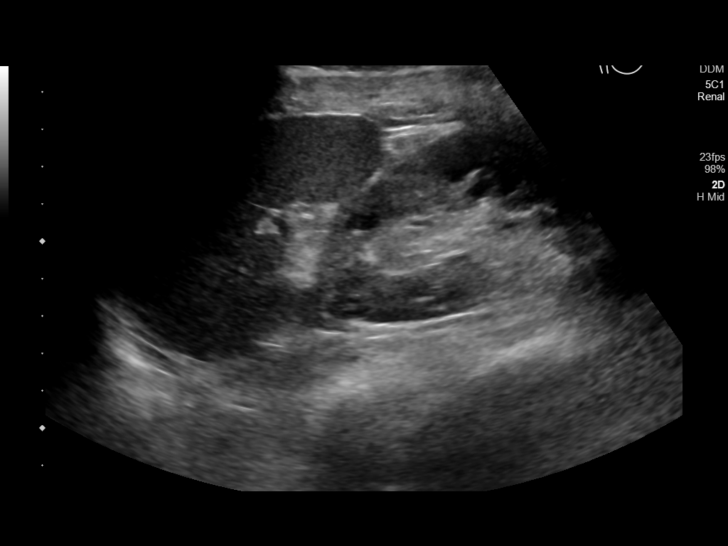
[im 26/48]
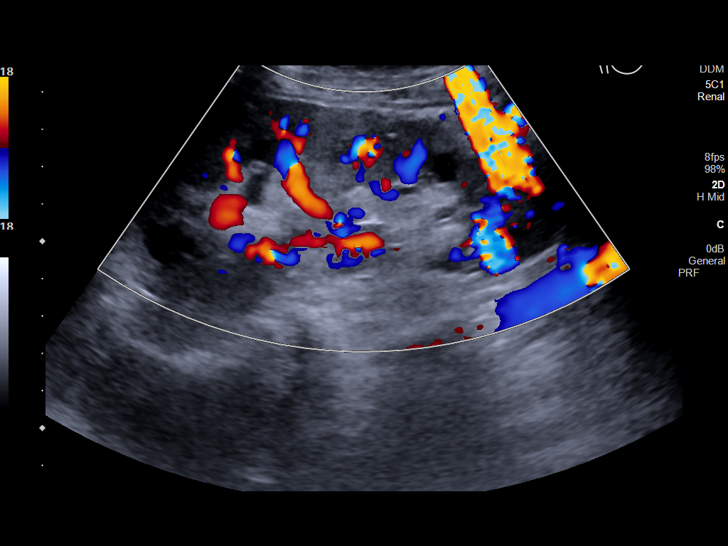
[im 30/48]
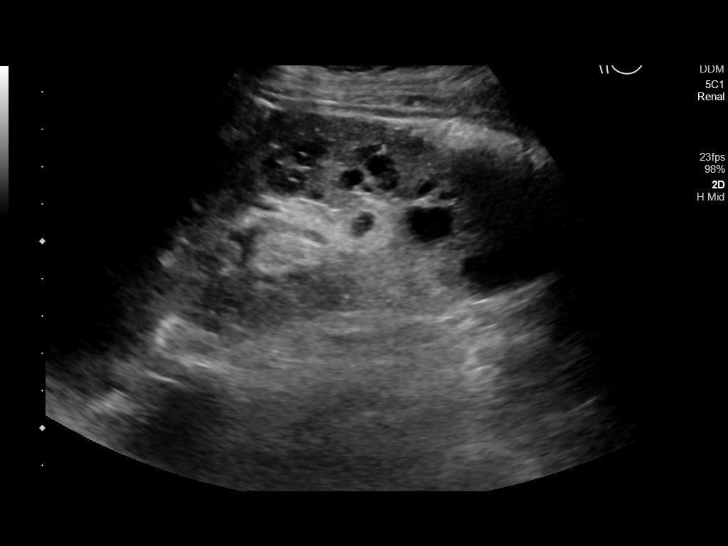
[im 32/48]
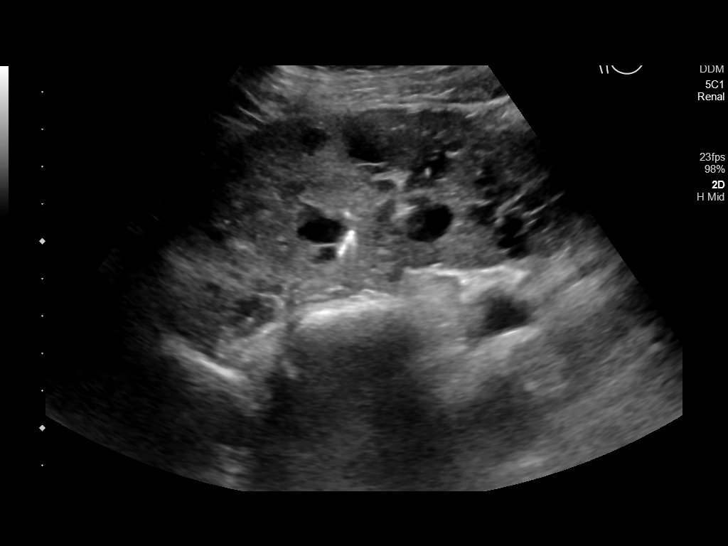
[im 36/48]
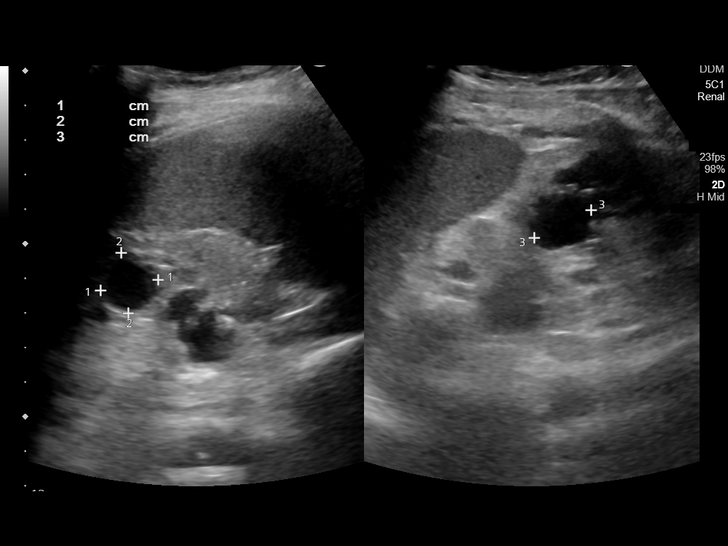
[im 40/48]
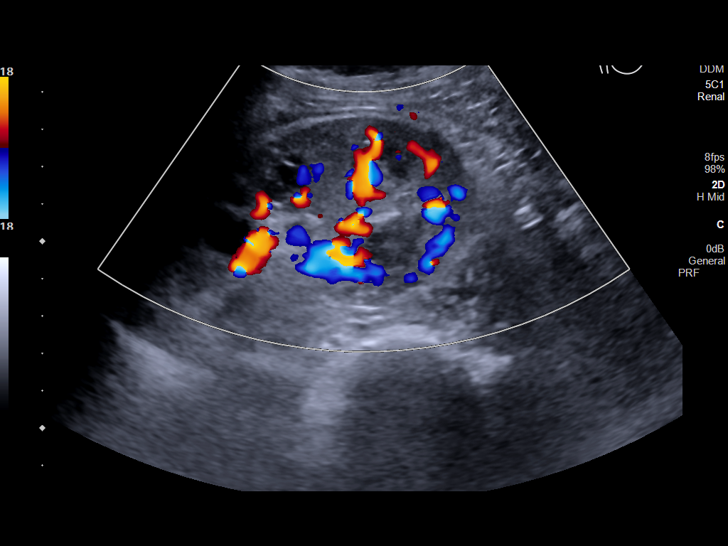
[im 44/48]
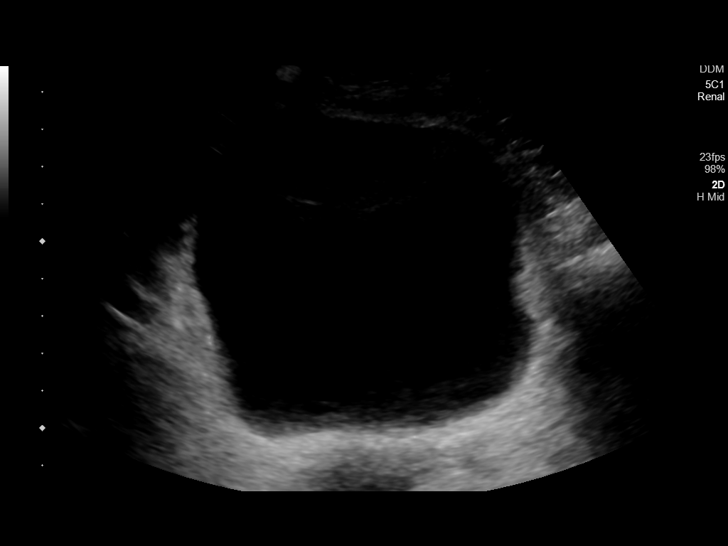
[im 48/48]
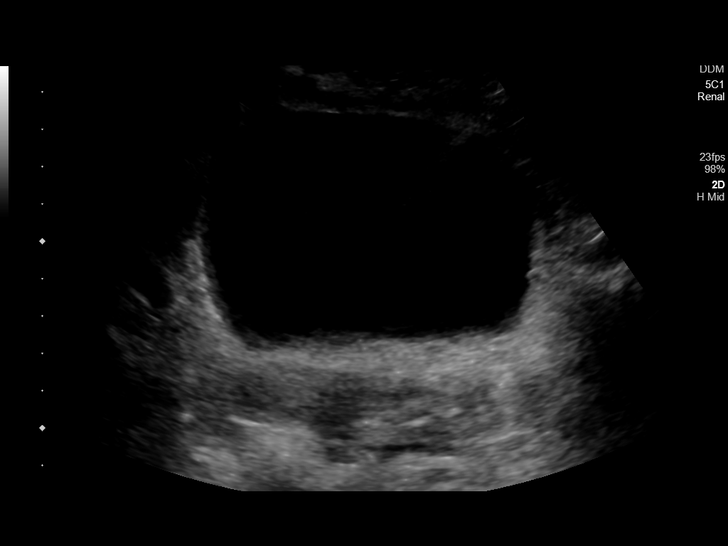

[14 of 25 positions shown; findings below may reference images not displayed]

FINDINGS: Right Kidney:

Renal measurements: 13.2 x 6.3 x 6.3 cm = volume: 270.5 mL. There
are innumerable simple appearing renal cortical cysts unchanged
since prior exam. No evidence of renal calculi or hydronephrosis.
Echotexture is grossly unremarkable.

Left Kidney:

Renal measurements: 11.4 x 4.6 x 6.2 cm = volume: 169.3 mL. There
are innumerable simple appearing left renal cyst. No hydronephrosis
or nephrolithiasis. Echotexture is normal.

Bladder:

Appears normal for degree of bladder distention.

Other:

None.
IMPRESSION: 1. Innumerable bilateral renal cortical cysts not appreciably
changed since recent CT.
2. No evidence of hydronephrosis or nephrolithiasis within either
kidney.
